# Patient Record
Sex: Male | Born: 1965 | Race: Black or African American | Hispanic: No | Smoking: Never smoker
Health system: Southern US, Community
[De-identification: ages and names within clinical notes are randomized; demographics above are authoritative.]

## PROBLEM LIST (undated history)

## (undated) DIAGNOSIS — G4733 Obstructive sleep apnea (adult) (pediatric): Secondary | ICD-10-CM

## (undated) DIAGNOSIS — Z9989 Dependence on other enabling machines and devices: Secondary | ICD-10-CM

## (undated) DIAGNOSIS — E119 Type 2 diabetes mellitus without complications: Secondary | ICD-10-CM

## (undated) HISTORY — PX: KNEE CARTILAGE SURGERY: SHX688

## (undated) HISTORY — DX: Type 2 diabetes mellitus without complications: E11.9

## (undated) HISTORY — DX: Obstructive sleep apnea (adult) (pediatric): G47.33

## (undated) HISTORY — DX: Dependence on other enabling machines and devices: Z99.89

---

## 1999-04-30 ENCOUNTER — Emergency Department (HOSPITAL_COMMUNITY): Admission: EM | Admit: 1999-04-30 | Discharge: 1999-04-30 | Payer: Self-pay | Admitting: Emergency Medicine

## 2002-01-15 ENCOUNTER — Emergency Department (HOSPITAL_COMMUNITY): Admission: EM | Admit: 2002-01-15 | Discharge: 2002-01-15 | Payer: Self-pay | Admitting: *Deleted

## 2002-06-13 ENCOUNTER — Emergency Department (HOSPITAL_COMMUNITY): Admission: EM | Admit: 2002-06-13 | Discharge: 2002-06-14 | Payer: Self-pay

## 2002-06-14 ENCOUNTER — Encounter: Payer: Self-pay | Admitting: Emergency Medicine

## 2007-07-30 ENCOUNTER — Emergency Department (HOSPITAL_COMMUNITY): Admission: EM | Admit: 2007-07-30 | Discharge: 2007-07-30 | Payer: Self-pay | Admitting: Emergency Medicine

## 2011-11-20 ENCOUNTER — Ambulatory Visit: Payer: Self-pay | Admitting: *Deleted

## 2011-11-26 ENCOUNTER — Ambulatory Visit: Payer: Self-pay | Admitting: *Deleted

## 2012-06-07 ENCOUNTER — Emergency Department (HOSPITAL_COMMUNITY)
Admission: EM | Admit: 2012-06-07 | Discharge: 2012-06-08 | Disposition: A | Discharge status: Home / Self Care | Payer: 59 | Attending: Emergency Medicine | Admitting: Emergency Medicine

## 2012-06-07 ENCOUNTER — Encounter (HOSPITAL_COMMUNITY): Payer: Self-pay | Admitting: *Deleted

## 2012-06-07 DIAGNOSIS — R112 Nausea with vomiting, unspecified: Secondary | ICD-10-CM | POA: Insufficient documentation

## 2012-06-07 DIAGNOSIS — R51 Headache: Secondary | ICD-10-CM

## 2012-06-07 DIAGNOSIS — E119 Type 2 diabetes mellitus without complications: Secondary | ICD-10-CM | POA: Insufficient documentation

## 2012-06-07 DIAGNOSIS — I10 Essential (primary) hypertension: Secondary | ICD-10-CM | POA: Insufficient documentation

## 2012-06-07 DIAGNOSIS — G43909 Migraine, unspecified, not intractable, without status migrainosus: Secondary | ICD-10-CM | POA: Insufficient documentation

## 2012-06-07 MED ORDER — ONDANSETRON 8 MG PO TBDP
8.0000 mg | ORAL_TABLET | Freq: Once | ORAL | Status: AC
  Administered 2012-06-07: 8 mg via ORAL
  Filled 2012-06-07: qty 1

## 2012-06-07 NOTE — ED Notes (Signed)
Pt c/o migraine since this am, vomiting and weakness that started today.

## 2012-06-07 NOTE — ED Notes (Signed)
Pt stated he started having migraines that put him on his knees with pain at 1400. Migraine decreased with zofran from 8/10 to 2.

## 2012-06-08 LAB — COMPREHENSIVE METABOLIC PANEL
ALT: 47 U/L (ref 0–53)
AST: 28 U/L (ref 0–37)
CO2: 26 mEq/L (ref 19–32)
Calcium: 9.7 mg/dL (ref 8.4–10.5)
Creatinine, Ser: 0.97 mg/dL (ref 0.50–1.35)
GFR calc Af Amer: >90 mL/min (ref >90)
GFR calc non Af Amer: >90 mL/min (ref >90)
Sodium: 135 mEq/L (ref 135–145)
Total Protein: 6.9 g/dL (ref 6.0–8.3)

## 2012-06-08 LAB — CBC WITH DIFFERENTIAL/PLATELET
Basophils Absolute: 0 10*3/uL (ref 0.0–0.1)
Basophils Relative: 0 % (ref 0–1)
Eosinophils Absolute: 0.2 10*3/uL (ref 0.0–0.7)
Eosinophils Relative: 4 % (ref 0–5)
HCT: 40.8 % (ref 39.0–52.0)
MCH: 27 pg (ref 26.0–34.0)
MCHC: 32.4 g/dL (ref 30.0–36.0)
MCV: 83.6 fL (ref 78.0–100.0)
Monocytes Absolute: 0.5 10*3/uL (ref 0.1–1.0)
Platelets: 245 10*3/uL (ref 150–400)
RDW: 13.7 % (ref 11.5–15.5)
WBC: 6.2 10*3/uL (ref 4.0–10.5)

## 2012-06-08 LAB — URINALYSIS, ROUTINE W REFLEX MICROSCOPIC
Bilirubin Urine: NEGATIVE
Hgb urine dipstick: NEGATIVE
Nitrite: NEGATIVE
Specific Gravity, Urine: 1.023 (ref 1.005–1.030)
Urobilinogen, UA: 0.2 mg/dL (ref 0.0–1.0)
pH: 6 (ref 5.0–8.0)

## 2012-06-08 MED ORDER — METOCLOPRAMIDE HCL 5 MG/ML IJ SOLN
10.0000 mg | Freq: Once | INTRAMUSCULAR | Status: AC
  Administered 2012-06-08: 10 mg via INTRAVENOUS

## 2012-06-08 MED ORDER — PROMETHAZINE HCL 25 MG PO TABS: 25.0000 mg | ORAL_TABLET | Freq: Four times a day (QID) | ORAL | Status: DC | PRN

## 2012-06-08 MED ORDER — DEXAMETHASONE SODIUM PHOSPHATE 10 MG/ML IJ SOLN
10.0000 mg | Freq: Once | INTRAMUSCULAR | Status: AC
  Administered 2012-06-08: 10 mg via INTRAVENOUS
  Filled 2012-06-08: qty 1

## 2012-06-08 MED ORDER — DIPHENHYDRAMINE HCL 50 MG/ML IJ SOLN
12.5000 mg | Freq: Once | INTRAMUSCULAR | Status: AC
  Administered 2012-06-08: 12.5 mg via INTRAVENOUS

## 2012-06-08 NOTE — ED Provider Notes (Signed)
Medical screening examination/treatment/procedure(s) were performed by non-physician practitioner and as supervising physician I was immediately available for consultation/collaboration.  Olivia Mackie, MD 06/08/12 [phone]

## 2012-06-08 NOTE — ED Provider Notes (Signed)
History     CSN: [id number]  Arrival date & time 06/07/12  1959   First MD Initiated Contact with Patient 06/07/12 2359      Chief Complaint  Patient presents with  . Migraine  . Nausea  . Emesis    (Consider location/radiation/quality/duration/timing/severity/associated sxs/prior treatment) HPI History provided by pt.   Pt presents w/ c/o migraine.  Woke w/ severe, constant, throbbing pain in left frontal region at 2pm today.  No relief w/ aspirin but pain has gradually improved and is currently 1/10.  Associated w/ N/V and photophobia.  Denies fever, vision changes and dizziness.  No recent head trauma.  Has never been officially diagnosed w/ migraines but has had a similar headache approx once every 2 months for the past 1-2 years.   Past Medical History  Diagnosis Date  . Hypertension   . Diabetes mellitus without complication     Past Surgical History  Procedure Date  . Knee cartilage surgery     History reviewed. No pertinent family history.  History  Substance Use Topics  . Smoking status: Not on file  . Smokeless tobacco: Not on file  . Alcohol Use: No      Review of Systems  All other systems reviewed and are negative.    Allergies  Review of patient's allergies indicates no known allergies.  Home Medications   Current Outpatient Rx  Name  Route  Sig  Dispense  Refill  . ASPIRIN 325 MG PO TABS   Oral   Take 325 mg by mouth every 4 (four) hours as needed. Pain         . PSEUDOEPH-DOXYLAMINE-DM-APAP 60-7.11-25-998 MG/30ML PO LIQD   Oral   Take 30 mLs by mouth as needed. Cold symptoms           BP 129/85  Pulse 77  Temp 97.7 F (36.5 C) (Oral)  Resp 24  SpO2 100%  Physical Exam  Nursing note and vitals reviewed. Constitutional: He is oriented to person, place, and time. He appears well-developed and well-nourished. No distress.  HENT:  Head: Normocephalic and atraumatic.  Eyes:       Normal appearance  Neck: Normal range of  motion.       No meningeal signs  Cardiovascular: Normal rate, regular rhythm and intact distal pulses.   Pulmonary/Chest: Effort normal and breath sounds normal.  Musculoskeletal: Normal range of motion.  Neurological: He is alert and oriented to person, place, and time. No sensory deficit. Coordination normal.       CN 3-12 intact.  No nystagmus. 5/5 and equal upper and lower extremity strength.  No past pointing.     Skin: Skin is warm and dry. No rash noted.  Psychiatric: He has a normal mood and affect. His behavior is normal.    ED Course  Procedures (including critical care time)  Labs Reviewed  COMPREHENSIVE METABOLIC PANEL - Abnormal; Notable for the following:    Glucose, Bld 110 (*)     All other components within normal limits  CBC WITH DIFFERENTIAL  URINALYSIS, ROUTINE W REFLEX MICROSCOPIC   No results found.   1. Headache       MDM  Pt presents w/ non-traumatic headache.  Has has similar pain approx every 2 months for the past 1-2 years but has never been evaluated.  Pain currently minimal. On exam, pt well-appearing, afebrile, no focal neuro deficits or meningeal signs.  Nursing staff obtained labs which are pending.  Will treat w/ IV  reglan and decadron and then d/c home w/ referral to neuro.    Labs unremarkable. Results discussed w/ pt.  Pt reports that his pain and nausea have resolved.  D/c'd home w/ phenergan to relieve nausea and possibly headache pain, and recommended aleve as well.  Referred to neuro for further evaluation.  Return precautions discussed. 2:46 AM           Otilio Miu, PA-C 06/08/12 0246

## 2012-06-08 NOTE — ED Notes (Signed)
Pt made aware of need for urine specimen. Pt given urinal and call bell and instructed to let staff know when able to provide sample.

## 2012-11-01 ENCOUNTER — Encounter: Payer: Self-pay | Admitting: Family Medicine

## 2013-01-13 ENCOUNTER — Ambulatory Visit (INDEPENDENT_AMBULATORY_CARE_PROVIDER_SITE_OTHER): Payer: 59 | Admitting: Emergency Medicine

## 2013-01-13 VITALS — BP 130/72 | HR 91 | Temp 97.9°F | Resp 18 | Ht 72.0 in | Wt 315.0 lb

## 2013-01-13 DIAGNOSIS — Z0000 Encounter for general adult medical examination without abnormal findings: Secondary | ICD-10-CM

## 2013-01-13 NOTE — Progress Notes (Signed)
[facility] [address] [place] [address] [phone]  Date:  01/13/2013   Name:  Joshua Paul   DOB:  1966/01/04   MRN:  [id number]  PCP:  No primary provider on file.    Chief Complaint: Annual Exam   History of Present Illness:  Joshua Paul is a 47 y.o. very pleasant male patient who presents with the following:  Wellness examination.  Problem list contains HBP and NIDDM but he is not and has never been treated for these conditions.  No improvement with over the counter medications or other home remedies. Denies other complaint or health concern today.   There are no active problems to display for this patient.   Past Medical History  Diagnosis Date  . Hypertension   . Diabetes mellitus without complication     Past Surgical History  Procedure Laterality Date  . Knee cartilage surgery      History  Substance Use Topics  . Smoking status: Never Smoker   . Smokeless tobacco: Not on file  . Alcohol Use: No    History reviewed. No pertinent family history.  No Known Allergies  Medication list has been reviewed and updated.  Current Outpatient Prescriptions on File Prior to Visit  Medication Sig Dispense Refill  . aspirin 325 MG tablet Take 325 mg by mouth every 4 (four) hours as needed. Pain      . promethazine (PHENERGAN) 25 MG tablet Take 1 tablet (25 mg total) by mouth every 6 (six) hours as needed for nausea.  20 tablet  0  . Pseudoeph-Doxylamine-DM-APAP (NYQUIL) 60-7.11-25-998 MG/30ML LIQD Take 30 mLs by mouth as needed. Cold symptoms       No current facility-administered medications on file prior to visit.    Review of Systems:  As per HPI, otherwise negative.    Physical Examination: Filed Vitals:   01/13/13 1244  BP: 130/72  Pulse: 91  Temp: 97.9 F (36.6 C)  Resp: 18   Filed Vitals:   01/13/13 1244  Height: 6' (1.829 m)  Weight: 315 lb (142.883 kg)   Body mass index is 42.71 kg/(m^2). Ideal Body  Weight: Weight in (lb) to have BMI = 25: 183.9  GEN: overweight, NAD, Non-toxic, A & O x 3 HEENT: Atraumatic, Normocephalic. Neck supple. No masses, No LAD. Ears and Nose: No external deformity. CV: RRR, No M/G/R. No JVD. No thrill. No extra heart sounds. PULM: CTA B, no wheezes, crackles, rhonchi. No retractions. No resp. distress. No accessory muscle use. ABD: S, NT, ND, +BS. No rebound. No HSM. EXTR: No c/c/e NEURO Normal gait.  PSYCH: Normally interactive. Conversant. Not depressed or anxious appearing.  Calm demeanor.    Assessment and Plan: Wellness examination. Obesity Advised to lose weight. Needs labs   Signed,  Phillips Odor, MD

## 2013-01-13 NOTE — Patient Instructions (Addendum)
Calorie Counting Diet A calorie counting diet requires you to eat the number of calories that are right for you in a day. Calories are the measurement of how much energy you get from the food you eat. Eating the right amount of calories is important for staying at a healthy weight. If you eat too many calories, your body will store them as fat and you may gain weight. If you eat too few calories, you may lose weight. Counting the number of calories you eat during a day will help you know if you are eating the right amount. A Registered Dietitian can determine how many calories you need in a day. The amount of calories needed varies from person to person. If your goal is to lose weight, you will need to eat fewer calories. Losing weight can benefit you if you are overweight or have health problems such as heart disease, high blood pressure, or diabetes. If your goal is to gain weight, you will need to eat more calories. Gaining weight may be necessary if you have a certain health problem that causes your body to need more energy. TIPS Whether you are increasing or decreasing the number of calories you eat during a day, it may be hard to get used to changes in what you eat and drink. The following are tips to help you keep track of the number of calories you eat.  Measure foods at home with measuring cups. This helps you know the amount of food and number of calories you are eating.  Restaurants often serve food in amounts that are larger than 1 serving. While eating out, estimate how many servings of a food you are given. For example, a serving of cooked rice is  cup or about the size of half of a fist. Knowing serving sizes will help you be aware of how much food you are eating at restaurants.  Ask for smaller portion sizes or child-size portions at restaurants.  Plan to eat half of a meal at a restaurant. Take the rest home or share the other half with a friend.  Read the Nutrition Facts panel on  food labels for calorie content and serving size. You can find out how many servings are in a package, the size of a serving, and the number of calories each serving has.  For example, a package might contain 3 cookies. The Nutrition Facts panel on that package says that 1 serving is 1 cookie. Below that, it will say there are 3 servings in the container. The calories section of the Nutrition Facts label says there are 90 calories. This means there are 90 calories in 1 cookie (1 serving). If you eat 1 cookie you have eaten 90 calories. If you eat all 3 cookies, you have eaten 270 calories (3 servings x 90 calories = 270 calories). The list below tells you how big or small some common portion sizes are.  1 oz.........4 stacked dice.  3 oz.........Deck of cards.  1 tsp........Tip of little finger.  1 tbs........Thumb.  2 tbs........Golf ball.   cup.......Half of a fist.  1 cup........A fist. KEEP A FOOD LOG Write down every food item you eat, the amount you eat, and the number of calories in each food you eat during the day. At the end of the day, you can add up the total number of calories you have eaten. It may help to keep a list like the one below. Find out the calorie information by reading the   Nutrition Facts panel on food labels. Breakfast  Bran cereal (1 cup, 110 calories).  Fat-free milk ( cup, 45 calories). Snack  Apple (1 medium, 80 calories). Lunch  Spinach (1 cup, 20 calories).  Tomato ( medium, 20 calories).  Chicken breast strips (3 oz, 165 calories).  Shredded cheddar cheese ( cup, 110 calories).  Light Italian dressing (2 tbs, 60 calories).  Whole-wheat bread (1 slice, 80 calories).  Tub margarine (1 tsp, 35 calories).  Vegetable soup (1 cup, 160 calories). Dinner  Pork chop (3 oz, 190 calories).  Brown rice (1 cup, 215 calories).  Steamed broccoli ( cup, 20 calories).  Strawberries (1  cup, 65 calories).  Whipped cream (1 tbs, 50  calories). Daily Calorie Total: 1425 Document Released: 06/15/2005 Document Revised: 09/07/2011 Document Reviewed: 12/10/2006 [employer] Patient Information 2014 [employer].  

## 2013-12-06 ENCOUNTER — Ambulatory Visit (INDEPENDENT_AMBULATORY_CARE_PROVIDER_SITE_OTHER): Payer: 59 | Admitting: Emergency Medicine

## 2013-12-06 VITALS — BP 132/66 | HR 89 | Temp 97.9°F | Ht 72.1 in | Wt 329.0 lb

## 2013-12-06 DIAGNOSIS — Z0000 Encounter for general adult medical examination without abnormal findings: Secondary | ICD-10-CM

## 2013-12-06 LAB — POCT URINALYSIS DIPSTICK
Bilirubin, UA: NEGATIVE
GLUCOSE UA: NEGATIVE
Ketones, UA: NEGATIVE
Leukocytes, UA: NEGATIVE
NITRITE UA: NEGATIVE
PH UA: 5.5
PROTEIN UA: NEGATIVE
RBC UA: 15
Spec Grav, UA: <=1.005
UROBILINOGEN UA: 0.2

## 2013-12-06 LAB — POCT UA - MICROSCOPIC ONLY
CASTS, UR, LPF, POC: NEGATIVE
CRYSTALS, UR, HPF, POC: NEGATIVE
MUCUS UA: NEGATIVE
YEAST UA: NEGATIVE

## 2013-12-06 NOTE — Addendum Note (Signed)
Addended by: Carmelina Dane on: 12/06/2013 07:40 PM   Modules accepted: Orders

## 2013-12-06 NOTE — Progress Notes (Signed)
[facility] [address] [place] [address] [phone]  Date:  12/06/2013   Name:  Joshua Paul.   DOB:  [birth year]   MRN:  [id number]  PCP:  No PCP Per Patient    Chief Complaint: Annual Exam   History of Present Illness:  Safir Taulbee. is a 48 y.o. very pleasant male patient who presents with the following:  Wellness examination. No medications.  Denies other complaint or health concern today.   There are no active problems to display for this patient.   Past Medical History  Diagnosis Date  . Hypertension   . Diabetes mellitus without complication     Past Surgical History  Procedure Laterality Date  . Knee cartilage surgery      History  Substance Use Topics  . Smoking status: Never Smoker   . Smokeless tobacco: Not on file  . Alcohol Use: No    History reviewed. No pertinent family history.  No Known Allergies  Medication list has been reviewed and updated.  No current outpatient prescriptions on file prior to visit.   No current facility-administered medications on file prior to visit.    Review of Systems:  As per HPI, otherwise negative.    Physical Examination: Filed Vitals:   12/06/13 1848  BP: 132/66  Pulse: 89  Temp: 97.9 F (36.6 C)   Filed Vitals:   12/06/13 1848  Height: 6' 0.1" (1.831 m)  Weight: 329 lb (149.233 kg)   Body mass index is 44.51 kg/(m^2). Ideal Body Weight: Weight in (lb) to have BMI = 25: 184.5  GEN: obese, NAD, Non-toxic, A & O x 3 HEENT: Atraumatic, Normocephalic. Neck supple. No masses, No LAD. Ears and Nose: No external deformity. CV: RRR, No M/G/R. No JVD. No thrill. No extra heart sounds. PULM: CTA B, no wheezes, crackles, rhonchi. No retractions. No resp. distress. No accessory muscle use. ABD: S, NT, ND, +BS. No rebound. No HSM. EXTR: No c/c/e NEURO Normal gait.  PSYCH: Normally interactive. Conversant. Not depressed or anxious appearing.  Calm demeanor.     Assessment and Plan: Wellness examination Obese  Signed,  Phillips Odor, MD

## 2014-03-31 ENCOUNTER — Ambulatory Visit (INDEPENDENT_AMBULATORY_CARE_PROVIDER_SITE_OTHER): Payer: 59 | Admitting: Emergency Medicine

## 2014-03-31 VITALS — BP 134/83 | HR 96 | Temp 99.0°F | Resp 16 | Ht 72.5 in | Wt 329.2 lb

## 2014-03-31 DIAGNOSIS — R509 Fever, unspecified: Secondary | ICD-10-CM

## 2014-03-31 DIAGNOSIS — J029 Acute pharyngitis, unspecified: Secondary | ICD-10-CM

## 2014-03-31 LAB — POCT CBC
Granulocyte percent: 77.9 %G (ref 37–80)
HCT, POC: 44.7 % (ref 43.5–53.7)
Hemoglobin: 14 g/dL — AB (ref 14.1–18.1)
LYMPH, POC: 1.4 (ref 0.6–3.4)
MCH: 27.4 pg (ref 27–31.2)
MCHC: 31.4 g/dL — AB (ref 31.8–35.4)
MCV: 87.2 fL (ref 80–97)
MID (CBC): 0.7 (ref 0–0.9)
MPV: 7.2 fL (ref 0–99.8)
PLATELET COUNT, POC: 265 10*3/uL (ref 142–424)
POC Granulocyte: 7.2 — AB (ref 2–6.9)
POC LYMPH %: 15 % (ref 10–50)
POC MID %: 7.1 % (ref 0–12)
RBC: 5.13 M/uL (ref 4.69–6.13)
RDW, POC: 14.9 %
WBC: 9.2 10*3/uL (ref 4.6–10.2)

## 2014-03-31 LAB — POCT INFLUENZA A/B
Influenza A, POC: NEGATIVE
Influenza B, POC: NEGATIVE

## 2014-03-31 LAB — POCT RAPID STREP A (OFFICE): Rapid Strep A Screen: NEGATIVE

## 2014-03-31 MED ORDER — AZELASTINE HCL 0.1 % NA SOLN: 2.0000 | Freq: Two times a day (BID) | NASAL | Status: DC

## 2014-03-31 MED ORDER — BENZONATATE 100 MG PO CAPS: 100.0000 mg | ORAL_CAPSULE | Freq: Three times a day (TID) | ORAL | Status: DC | PRN

## 2014-03-31 NOTE — Progress Notes (Addendum)
This chart was scribed for Lesle Chris, MD by Luisa Dago, ED Scribe. This patient was seen in room 12 and the patient's care was started at 3:47 PM.  Subjective:    Patient ID: Joshua Space., male    DOB: [birth year], 48 y.o.   MRN: [id number]  Chief Complaint  Patient presents with  . Sore Throat    x 2 days   . Generalized Body Aches    x 2 days   . Headache     x 2 days     HPI Joshua Neal. is a 48 y.o. Male with a hx of HTN and DM without complications.   Pt presents to the office today with multiple complaints. He comes in with gradual onset sore throat that started 2 days ago. Pt is also complaining of associated generalized myalgia and H/A that also started 2 days ago. He states that the headache has subsided a little since he's been here. Joshua Paul reports a productive cough with thick sputum this AM, but has not had any recurrence since then. He denies any nasal congestion, sick contacts, fever, chills, diaphoresis, rhinorrhea, nausea, emesis, or abdominal pain.   There are no active problems to display for this patient.  Past Medical History  Diagnosis Date  . Hypertension   . Diabetes mellitus without complication    Past Surgical History  Procedure Laterality Date  . Knee cartilage surgery     No Known Allergies Prior to Admission medications   Not on File   History   Social History  . Marital Status: Married    Spouse Name: N/A    Number of Children: N/A  . Years of Education: N/A   Occupational History  . Not on file.   Social History Main Topics  . Smoking status: Never Smoker   . Smokeless tobacco: Not on file  . Alcohol Use: No  . Drug Use: No  . Sexual Activity: Not on file   Other Topics Concern  . Not on file   Social History Narrative  . No narrative on file    Review of Systems  Constitutional: Positive for fatigue. Negative for fever and chills.  HENT: Positive for ear pain and sore throat. Negative for  congestion and trouble swallowing.   Eyes: Negative for discharge.  Respiratory: Positive for cough.   Cardiovascular: Negative for chest pain.  Gastrointestinal: Negative for nausea, vomiting and abdominal pain.  Genitourinary: Negative for frequency and hematuria.  Skin: Negative for rash.  Neurological: Positive for headaches. Negative for dizziness.  Psychiatric/Behavioral: Negative for hallucinations.       Objective:   Physical Exam  Nursing note and vitals reviewed. Constitutional: He is oriented to person, place, and time. He appears well-developed and well-nourished. No distress.  HENT:  Head: Normocephalic and atraumatic.  Right Ear: External ear normal.  Left Ear: External ear normal.  Mouth/Throat: Posterior oropharyngeal erythema (mild) present.  Significant nasal congestion with purulent drainage inside his nose.   Eyes: Conjunctivae are normal. Right eye exhibits no discharge. Left eye exhibits no discharge.  Neck: Neck supple.  Cardiovascular: Normal rate, regular rhythm and normal heart sounds.  Exam reveals no gallop and no friction rub.   No murmur heard. Pulmonary/Chest: Effort normal and breath sounds normal. No respiratory distress.  Abdominal: Soft. He exhibits no distension. There is no tenderness.  Musculoskeletal: He exhibits no edema and no tenderness.  Neurological: He is alert and oriented to person, place, and  time.  Skin: Skin is warm and dry.  Psychiatric: He has a normal mood and affect. His behavior is normal. Thought content normal.    Filed Vitals:   03/31/14 1524  BP: 134/83  Pulse: 96  Temp: 99 F (37.2 C)  TempSrc: Oral  Resp: 16  Height: 6' 0.5" (1.842 m)  Weight: 329 lb 3.2 oz (149.324 kg)  SpO2: 98%   Results for orders placed in visit on 03/31/14  POCT INFLUENZA A/B      Result Value Ref Range   Influenza A, POC Negative     Influenza B, POC Negative    POCT RAPID STREP A (OFFICE)      Result Value Ref Range   Rapid Strep  A Screen Negative  Negative   Results for orders placed in visit on 03/31/14  POCT INFLUENZA A/B      Result Value Ref Range   Influenza A, POC Negative     Influenza B, POC Negative    POCT RAPID STREP A (OFFICE)      Result Value Ref Range   Rapid Strep A Screen Negative  Negative  POCT CBC      Result Value Ref Range   WBC 9.2  4.6 - 10.2 K/uL   Lymph, poc 1.4  0.6 - 3.4   POC LYMPH PERCENT 15.0  10 - 50 %L   MID (cbc) 0.7  0 - 0.9   POC MID % 7.1  0 - 12 %M   POC Granulocyte 7.2 (*) 2 - 6.9   Granulocyte percent 77.9  37 - 80 %G   RBC 5.13  4.69 - 6.13 M/uL   Hemoglobin 14.0 (*) 14.1 - 18.1 g/dL   HCT, POC 16.144.7  09.643.5 - 53.7 %   MCV 87.2  80 - 97 fL   MCH, POC 27.4  27 - 31.2 pg   MCHC 31.4 (*) 31.8 - 35.4 g/dL   RDW, POC 04.514.9     Platelet Count, POC 265  142 - 424 K/uL   MPV 7.2  0 - 99.8 fL    Assessment & Plan:   Will treat with Tessalon Perles, Astelin and  out of work today and tomorrow . I personally performed the services described in this documentation, which was scribed in my presence. The recorded information has been reviewed and is accurate.

## 2014-04-03 LAB — CULTURE, GROUP A STREP: Organism ID, Bacteria: NORMAL

## 2014-06-29 DIAGNOSIS — E119 Type 2 diabetes mellitus without complications: Secondary | ICD-10-CM

## 2014-06-29 HISTORY — DX: Type 2 diabetes mellitus without complications: E11.9

## 2014-10-29 ENCOUNTER — Ambulatory Visit (INDEPENDENT_AMBULATORY_CARE_PROVIDER_SITE_OTHER): Payer: 59 | Admitting: Family Medicine

## 2014-10-29 VITALS — BP 116/90 | HR 93 | Temp 98.2°F | Resp 16 | Ht 72.0 in | Wt 335.0 lb

## 2014-10-29 DIAGNOSIS — R05 Cough: Secondary | ICD-10-CM

## 2014-10-29 DIAGNOSIS — R5383 Other fatigue: Secondary | ICD-10-CM | POA: Diagnosis not present

## 2014-10-29 DIAGNOSIS — M6208 Separation of muscle (nontraumatic), other site: Secondary | ICD-10-CM

## 2014-10-29 DIAGNOSIS — R52 Pain, unspecified: Secondary | ICD-10-CM | POA: Diagnosis not present

## 2014-10-29 DIAGNOSIS — R059 Cough, unspecified: Secondary | ICD-10-CM

## 2014-10-29 LAB — POCT CBC
GRANULOCYTE PERCENT: 75.4 % (ref 37–80)
HCT, POC: 44.1 % (ref 43.5–53.7)
Hemoglobin: 14.3 g/dL (ref 14.1–18.1)
LYMPH, POC: 1 (ref 0.6–3.4)
MCH, POC: 27.3 pg (ref 27–31.2)
MCHC: 32.4 g/dL (ref 31.8–35.4)
MCV: 84.1 fL (ref 80–97)
MID (CBC): 0.3 (ref 0–0.9)
MPV: 7.1 fL (ref 0–99.8)
POC GRANULOCYTE: 3.7 (ref 2–6.9)
POC LYMPH PERCENT: 19.4 %L (ref 10–50)
POC MID %: 5.2 %M (ref 0–12)
Platelet Count, POC: 261 10*3/uL (ref 142–424)
RBC: 5.25 M/uL (ref 4.69–6.13)
RDW, POC: 15.1 %
WBC: 4.9 10*3/uL (ref 4.6–10.2)

## 2014-10-29 LAB — POCT INFLUENZA A/B
INFLUENZA A, POC: NEGATIVE
INFLUENZA B, POC: POSITIVE

## 2014-10-29 MED ORDER — HYDROCODONE-HOMATROPINE 5-1.5 MG/5ML PO SYRP: 5.0000 mL | ORAL_SOLUTION | Freq: Three times a day (TID) | ORAL | Status: DC | PRN

## 2014-10-29 MED ORDER — MELOXICAM 7.5 MG PO TABS: 7.5000 mg | ORAL_TABLET | Freq: Every day | ORAL | Status: DC

## 2014-10-29 NOTE — Progress Notes (Signed)
Subjective:    Patient ID: Joshua SpaceNathaniel E Hege Jr., male    DOB: [birth year], 49 y.o.   MRN: [id number]  Chief Complaint  Patient presents with  . Generalized Body Aches    Onset 5 days  . Nasal Congestion   There are no active problems to display for this patient.  Medications, allergies, past medical history, surgical history, family history, social history and problem list reviewed and updated.  HPI  49 yo obese male presents with congestion, body aches, and epigastric chest discomfort.   Sx started suddenly 5 days ago with fatigue, head/nasal congestion, generalized body aches. Sx have persisted. Mild prod persistent cough past couple days. Chills past couple days, no fevers. No appetite. Denies abd pain, n/v, diarrhea. Able to stomach liquids and keep them down. Interested in being tested for flu though aware tamiflu not beneficial at this time.   Also mentions epigastric tenderness past couple days along with noticing some skin darkening in the area. No exertional pain. No assoc sob. Not assoc with meals. Discomfort intermittent, sometimes worse with movement or while lying flat. No radiation through to back. No assoc presyncope, syncope.   Review of Systems See HPI.     Objective:   Physical Exam  Constitutional: He appears well-developed and well-nourished.  Non-toxic appearance. He does not have a sickly appearance. He appears ill. No distress.  BP 116/90 mmHg  Pulse 93  Temp(Src) 98.2 F (36.8 C) (Oral)  Resp 16  Ht 6' (1.829 m)  Wt 335 lb (151.955 kg)  BMI 45.42 kg/m2  SpO2 96%   HENT:  Right Ear: Tympanic membrane normal.  Left Ear: Tympanic membrane normal.  Nose: Nose normal. Right sinus exhibits no maxillary sinus tenderness and no frontal sinus tenderness. Left sinus exhibits no maxillary sinus tenderness and no frontal sinus tenderness.  Mouth/Throat: Uvula is midline, oropharynx is clear and moist and mucous membranes are normal.  Neck: No Brudzinski's sign  noted.  Cardiovascular: Normal rate, regular rhythm and normal heart sounds.   Pulmonary/Chest: Effort normal and breath sounds normal. No tachypnea.    4cm x 3cm circular bulge noted sub-xiphoid. Firm. Not mobile. TTP. Overlying lesion appears to be old scar. No overlying erythema. No fluctuance. No surrounding induration.   Abdominal: Soft. Normal appearance and bowel sounds are normal. He exhibits no abdominal bruit and no pulsatile midline mass. There is tenderness in the epigastric area. There is no rigidity, no rebound, no guarding, no CVA tenderness, no tenderness at McBurney's point and negative Murphy's sign.  Lymphadenopathy:       Head (right side): No submental, no submandibular and no tonsillar adenopathy present.       Head (left side): No submental, no submandibular and no tonsillar adenopathy present.    He has no cervical adenopathy.    Results for orders placed or performed in visit on 10/29/14  POCT Influenza A/B  Result Value Ref Range   Influenza A, POC Negative    Influenza B, POC Positive   POCT CBC  Result Value Ref Range   WBC 4.9 4.6 - 10.2 K/uL   Lymph, poc 1.0 0.6 - 3.4   POC LYMPH PERCENT 19.4 10 - 50 %L   MID (cbc) 0.3 0 - 0.9   POC MID % 5.2 0 - 12 %M   POC Granulocyte 3.7 2 - 6.9   Granulocyte percent 75.4 37 - 80 %G   RBC 5.25 4.69 - 6.13 M/uL   Hemoglobin 14.3 14.1 - 18.1  g/dL   HCT, POC 16.1 09.6 - 53.7 %   MCV 84.1 80 - 97 fL   MCH, POC 27.3 27 - 31.2 pg   MCHC 32.4 31.8 - 35.4 g/dL   RDW, POC 04.5 %   Platelet Count, POC 261 142 - 424 K/uL   MPV 7.1 0 - 99.8 fL       Assessment & Plan:   49 yo obese male presents with congestion, body aches, and epigastric chest discomfort.   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

## 2014-10-29 NOTE — Progress Notes (Signed)
Patient discussed and examined with Mr. McVeigh. Agree with assessment and plan of care per his note.   

## 2014-10-29 NOTE — Patient Instructions (Signed)
You tested positive for the flu. You are too far out from the onset to benefit from tamiflu.  Rest, fluids, tylenol as needed will all help. You have a separation of the muscles at the top of your abdomen. This is likely from coughing.  Be sure to take the cough medicine as needed up to every 8 hours.  Please come back to see me Friday. I'll be here from 8am - 6pm so we can check on the bulge. If it is improved that is great. If it is continuing and is causing pain, we'll likely refer you for a CT scan.

## 2014-11-02 ENCOUNTER — Ambulatory Visit (INDEPENDENT_AMBULATORY_CARE_PROVIDER_SITE_OTHER): Payer: 59 | Admitting: Physician Assistant

## 2014-11-02 VITALS — BP 130/80 | HR 85 | Temp 97.9°F | Resp 16 | Ht 72.0 in | Wt 333.4 lb

## 2014-11-02 DIAGNOSIS — M6208 Separation of muscle (nontraumatic), other site: Secondary | ICD-10-CM | POA: Diagnosis not present

## 2014-11-02 NOTE — Patient Instructions (Signed)
Please be sure to pick up the cough medicine and the anti inflammatory medication. Please take them as prescribed for the next 5 days.  We'd like to see you back on Thursday 11/08/14 after you've been on the cough medicine for that time to see if the bulge has gotten any smaller. If not we may refer you for a CT scan.

## 2014-11-02 NOTE — Progress Notes (Signed)
   Subjective:    Patient ID: Joshua SpaceNathaniel E Halbert Jr., male    DOB: Oct 29, 1965, 49 y.o.   MRN: [id number]  Chief Complaint  Patient presents with  . Follow up    chest bruise    There are no active problems to display for this patient.  No chest pain, SOB, HA, dizziness, vision change, N/V, diarrhea, constipation, dysuria, urinary urgency or frequency, myalgias, arthralgias or rash.  HPI  Pt seen here 10/29/14 for uri sx and bulge in chest. Positive for flu at that time. 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 Was given meloxicam and hycodan. Instructed to take these meds, hopefully keeping cough at bay, and rtc in 4 days for evaluation with possible ct ordered if bulge persisted.  Today he returns feeling better recovered from the flu. He never picked up his meloxicam or hycodan. Has not been taking any cough medicine and has continued to cough. Denies cp, sob, trouble swallowing, vomiting.   Review of Systems See HPI.     Objective:   Physical Exam  Constitutional: He is oriented to person, place, and time. He appears well-developed and well-nourished.  Non-toxic appearance. He does not have a sickly appearance. He does not appear ill. No distress.  BP 130/80 mmHg  Pulse 85  Temp(Src) 97.9 F (36.6 C) (Oral)  Resp 16  Ht 6' (1.829 m)  Wt 333 lb 6.4 oz (151.229 kg)  BMI 45.21 kg/m2  SpO2 96%   Pulmonary/Chest:  Bulge persisting, same size (4cm x 3cm) just distal to sub xiphoid process. Mildly tender to palpation.   Neurological: He is alert and oriented to person, place, and time.  Psychiatric: He has a normal mood and affect. His speech is normal and behavior is normal.      Assessment & Plan:   Rectus diastasis --persisting but pt has not started taking his cough medicine and continues to cough --instructed to fill hycodan and meloxicam --rtc 6 days to determine if bulge persisting despite getting  rid of cough --poss CT if persisting at that time  Donnajean Lopesodd M. Briton Sellman, PA-C Physician Assistant-Certified [facility]  11/02/2014 2:58 PM

## 2015-01-03 ENCOUNTER — Ambulatory Visit (INDEPENDENT_AMBULATORY_CARE_PROVIDER_SITE_OTHER): Payer: 59 | Admitting: Family Medicine

## 2015-01-03 VITALS — BP 128/89 | HR 81 | Temp 98.8°F | Resp 18 | Ht 73.0 in | Wt 334.2 lb

## 2015-01-03 DIAGNOSIS — E669 Obesity, unspecified: Secondary | ICD-10-CM

## 2015-01-03 DIAGNOSIS — E119 Type 2 diabetes mellitus without complications: Secondary | ICD-10-CM

## 2015-01-03 DIAGNOSIS — Z125 Encounter for screening for malignant neoplasm of prostate: Secondary | ICD-10-CM

## 2015-01-03 DIAGNOSIS — Z13220 Encounter for screening for lipoid disorders: Secondary | ICD-10-CM

## 2015-01-03 DIAGNOSIS — Z131 Encounter for screening for diabetes mellitus: Secondary | ICD-10-CM

## 2015-01-03 DIAGNOSIS — Z23 Encounter for immunization: Secondary | ICD-10-CM

## 2015-01-03 DIAGNOSIS — Z0000 Encounter for general adult medical examination without abnormal findings: Secondary | ICD-10-CM | POA: Diagnosis not present

## 2015-01-03 DIAGNOSIS — Z6841 Body Mass Index (BMI) 40.0 and over, adult: Secondary | ICD-10-CM | POA: Diagnosis not present

## 2015-01-03 DIAGNOSIS — Z8042 Family history of malignant neoplasm of prostate: Secondary | ICD-10-CM | POA: Diagnosis not present

## 2015-01-03 DIAGNOSIS — G4733 Obstructive sleep apnea (adult) (pediatric): Secondary | ICD-10-CM | POA: Diagnosis not present

## 2015-01-03 DIAGNOSIS — Z9989 Dependence on other enabling machines and devices: Secondary | ICD-10-CM

## 2015-01-03 LAB — POCT URINALYSIS DIPSTICK
Bilirubin, UA: NEGATIVE
Glucose, UA: NEGATIVE
Ketones, UA: NEGATIVE
Leukocytes, UA: NEGATIVE
NITRITE UA: NEGATIVE
PH UA: 5.5
PROTEIN UA: NEGATIVE
RBC UA: NEGATIVE
Spec Grav, UA: 1.025
UROBILINOGEN UA: 0.2

## 2015-01-03 NOTE — Progress Notes (Signed)
Subjective:   This chart was scribed for Nilda Simmer, MD by Jarvis Morgan, ED Scribe. This patient was seen in Room 12 and the patient's care was started at 5:33 PM.   Patient ID: Joshua Space., male    DOB: January 23, 1966, 49 y.o.   MRN: [id number]  01/03/2015  Annual Exam   HPI  HPI Comments: Joshua Benincasa. is a 49 y.o. male who [facility] a complete [facility] physical. Pt's last physical was 1 year ago.  Past Medical History Pt has a h/o asthma and OSA. Pt has never had a colonoscopy or needed to have one. Pt is unsure of the date of his last tetanus shot. Pt received a flu shot in December 2015. Received Hepatitis B series with employment 22 years ago. Pt denies need to see an eye doctor. He states he is due for a dental checkup. He states he last asthma attack was when he was 49 years old and has not had one since. Pt wears his CPAP every night. He had knee cartilage surgery in his left knee. He denies any other recent surgeries or recent hospitalizations.   Social History He states he recently began exercising again. He does swimming and light weights. Pt has been married for 20 years and has 1 son who is 1 y.o. 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 Pt was formerly in the [employer] for 15 years. He states he always wears a seat belt. He denies any known allergies. He is a non smoker. He denies ETOH or illicit drug use.   Family History Mother is 19 years old with no chronic health problems. Father is 49 and is currently battling cancer, pt is unable to state what type of cancer. He reports his father has a h/o prostate cancer which he was dx with 15 years ago. Pt has 2 brothers, one has a HTN and the other has no chronic medical problems. He states he had an uncle that died at age 69 from colon cancer last year.   He reports a h/o shoulder pain but states he never followed up on it and he still has pain but it has begun  to resolve. Pt also admits that he sometimes has numbness to the top of his feet but believes it could be due to the shoes he wears for work. He denies any polyuria, frequent urination at night, or mouth lesions that won't heal.  He denies any tinnitus, HA, dizziness, blurred vision, double vision, SOB, chest pain, swelling in legs or feet, or cough. He denies a personal h/o colon cancer but as stated above, he does have a h/o colon cancer in his family. He denies any melena or bloody stools, nausea, vomiting, diarrhea or abdominal pain. He admits he has a bowel movement every day. Pt denies any anxiety or depression. He denies any penile dysfunction or decreased sex drive.  Review of Systems  Constitutional: Negative for fever, chills, diaphoresis, activity change, appetite change, fatigue and unexpected weight change.  HENT: Negative for congestion, dental problem, drooling, ear discharge, ear pain, facial swelling, hearing loss, mouth sores, nosebleeds, postnasal drip, rhinorrhea, sinus pressure, sneezing, sore throat, tinnitus, trouble swallowing and voice change.   Eyes: Negative for photophobia, pain, discharge, redness, itching and visual disturbance.  Respiratory: Negative for apnea, cough, choking, chest tightness, shortness of breath, wheezing and stridor.   Cardiovascular: Negative for chest pain, palpitations and leg swelling.  Gastrointestinal: Negative for nausea, vomiting, abdominal pain, diarrhea, constipation, blood in stool, abdominal distention, anal bleeding and rectal pain.  Endocrine: Negative for cold intolerance, heat intolerance, polydipsia, polyphagia and polyuria.  Genitourinary: Negative for dysuria, urgency, frequency, hematuria, flank pain, decreased urine volume, discharge, penile swelling, scrotal swelling, enuresis, difficulty urinating, genital sores, penile pain and testicular pain.  Musculoskeletal: Positive for arthralgias (mild left shoulder pain; gradually  resolving). Negative for myalgias, back pain, joint swelling, gait problem, neck pain and neck stiffness.  Skin: Negative for color change, pallor, rash and wound.  Allergic/Immunologic: Negative for environmental allergies, food allergies and immunocompromised state.  Neurological: Negative for dizziness, tremors, seizures, syncope, facial asymmetry, speech difficulty, weakness, light-headedness, numbness and headaches.  Hematological: Negative for adenopathy. Does not bruise/bleed easily.  Psychiatric/Behavioral: Positive for sleep disturbance. Negative for suicidal ideas, hallucinations, behavioral problems, confusion, self-injury, dysphoric mood, decreased concentration and agitation. The patient is not nervous/anxious and is not hyperactive.     Past Medical History  Diagnosis Date  . Hypertension   . Diabetes mellitus without complication   . Asthma     childhood only.  . OSA on CPAP    Past Surgical History  Procedure Laterality Date  . Knee cartilage surgery     No Known Allergies Current Outpatient Prescriptions  Medication Sig Dispense Refill  . HYDROcodone-homatropine (HYCODAN) 5-1.5 MG/5ML syrup Take 5 mLs by mouth every 8 (eight) hours as needed for cough. (Patient not taking: Reported on 11/02/2014) 120 mL 0   No current facility-administered medications for this visit.   History   Social History  . Marital Status: Married    Spouse Name: N/A  . Number of Children: N/A  . Years of Education: N/A   Occupational History  . Not on file.   Social History Main Topics  . Smoking status: Never Smoker   . Smokeless tobacco: Not on file  . Alcohol Use: No  . Drug Use: No  . Sexual Activity: Not on file   Other Topics Concern  . 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      Tobacco: none      Alcohol:  none      Drugs :none      Exercise:  Just started exercising again in 2016; swimming and weight lifting      Seatbelt: 100%      Family History  Problem Relation Age of Onset  . Arthritis Mother   . Cancer Father 2553    prostate cancer   . Hypertension Brother         Objective:    Triage Vitals: BP 128/89 mmHg  Pulse 81  Temp(Src) 98.8 F (37.1 C) (Oral)  Resp 18  Ht 6\' 1"  (1.854 m)  Wt 334 lb 3.2 oz (151.592 kg)  BMI 44.10 kg/m2  SpO2 97%  Physical Exam  Constitutional: He is oriented to person, place, and time. He appears well-developed and well-nourished. No distress.  Obese male  HENT:  Head: Normocephalic and atraumatic.  Right Ear: Tympanic membrane, external ear and ear canal normal.  Left Ear: Tympanic membrane, external ear and ear canal normal.  Nose: Nose normal.  Mouth/Throat: Oropharynx is clear and moist.  Eyes: Conjunctivae and EOM are normal. Pupils are equal, round,  and reactive to light.  Neck: Normal range of motion. Neck supple. Carotid bruit is not present. No tracheal deviation present. No thyromegaly present.  Cardiovascular: Normal rate, regular rhythm, normal heart sounds and intact distal pulses.  Exam reveals no gallop and no friction rub.   No murmur heard. Pulmonary/Chest: Effort normal and breath sounds normal. No respiratory distress. He has no wheezes. He has no rales.  Abdominal: Soft. Normal appearance and bowel sounds are normal. He exhibits no distension and no mass. There is no tenderness. There is no rebound and no guarding. Hernia confirmed negative in the right inguinal area and confirmed negative in the left inguinal area.  Genitourinary: Rectum normal, prostate normal, testes normal and penis normal.  Musculoskeletal: Normal range of motion. He exhibits no tenderness.       Right shoulder: Normal.       Left shoulder: Normal.       Cervical back: Normal.  Lymphadenopathy:    He has no cervical adenopathy.  Neurological: He  is alert and oriented to person, place, and time. He has normal reflexes. No cranial nerve deficit. He exhibits normal muscle tone. Coordination normal.  Skin: Skin is warm and dry. No rash noted. He is not diaphoretic.  Psychiatric: He has a normal mood and affect. His behavior is normal. Judgment and thought content normal.  Nursing note and vitals reviewed.  Results for orders placed or performed in visit on 01/03/15  POCT urinalysis dipstick  Result Value Ref Range   Color, UA yellow    Clarity, UA clear    Glucose, UA neg    Bilirubin, UA neg    Ketones, UA neg    Spec Grav, UA 1.025    Blood, UA neg    pH, UA 5.5    Protein, UA neg    Urobilinogen, UA 0.2    Nitrite, UA neg    Leukocytes, UA Negative Negative       Assessment & Plan:   1. Routine physical examination   2. Screening, lipid   3. Screening for diabetes mellitus   4. Obesity   5. BMI 40.0-44.9, adult   6. Screening for prostate cancer   7. Family history of prostate cancer in father   51. Need for Tdap vaccination   9. OSA on CPAP     1. Complete Physical Examination:  Anticipatory guidance -- exercise, weight loss.  S/p TDAP in office.  Will warrant colonoscopy at age 70.  Clearance for participation in [facility]. 2.  Screening lipid: obtain FLP. 3. Screening DMII: obtain glucose, HgbA1c. 4.  Obesity/BMI 44:  Recommend weight loss, exercise, low-sugar and low-caloric food choices. 5.  Screening prostate cancer: s/p DRE; obtain PSA. 6.  Family history of prostate cancer:  Warrants PSA 7.  OSA: compliance with CPAP. 8.  S/p TDAP   No orders of the defined types were placed in this encounter.    Return in about 1 year (around 01/03/2016) for complete physical examiniation.  I personally performed the services described in this documentation, which was scribed in my presence. The recorded information has been reviewed and considered.  Obadiah Dennard Paulita Fujita, M.D. [facility] [address] [place], [place]  [address] [phone] phone [phone] fax

## 2015-01-03 NOTE — Patient Instructions (Signed)

## 2015-01-04 LAB — TSH: TSH: 1.244 u[IU]/mL (ref 0.350–4.500)

## 2015-01-04 LAB — CBC WITH DIFFERENTIAL/PLATELET
Basophils Absolute: 0 10*3/uL (ref 0.0–0.1)
Basophils Relative: 0 % (ref 0–1)
EOS ABS: 0.1 10*3/uL (ref 0.0–0.7)
EOS PCT: 2 % (ref 0–5)
HCT: 43.4 % (ref 39.0–52.0)
Hemoglobin: 14.2 g/dL (ref 13.0–17.0)
LYMPHS ABS: 1.2 10*3/uL (ref 0.7–4.0)
Lymphocytes Relative: 18 % (ref 12–46)
MCH: 27.5 pg (ref 26.0–34.0)
MCHC: 32.7 g/dL (ref 30.0–36.0)
MCV: 84.1 fL (ref 78.0–100.0)
MONO ABS: 0.5 10*3/uL (ref 0.1–1.0)
MONOS PCT: 8 % (ref 3–12)
MPV: 9.8 fL (ref 8.6–12.4)
NEUTROS PCT: 72 % (ref 43–77)
Neutro Abs: 4.8 10*3/uL (ref 1.7–7.7)
PLATELETS: 313 10*3/uL (ref 150–400)
RBC: 5.16 MIL/uL (ref 4.22–5.81)
RDW: 14.8 % (ref 11.5–15.5)
WBC: 6.6 10*3/uL (ref 4.0–10.5)

## 2015-01-04 LAB — LIPID PANEL
CHOL/HDL RATIO: 9 ratio
CHOLESTEROL: 297 mg/dL — AB (ref 0–200)
HDL: 33 mg/dL — ABNORMAL LOW (ref >=40)
LDL Cholesterol: 193 mg/dL — ABNORMAL HIGH (ref 0–99)
Triglycerides: 355 mg/dL — ABNORMAL HIGH (ref <150)
VLDL: 71 mg/dL — AB (ref 0–40)

## 2015-01-04 LAB — HEMOGLOBIN A1C
Hgb A1c MFr Bld: 8.1 % — ABNORMAL HIGH (ref <5.7)
MEAN PLASMA GLUCOSE: 186 mg/dL — AB (ref <117)

## 2015-01-04 LAB — COMPREHENSIVE METABOLIC PANEL
ALK PHOS: 44 U/L (ref 39–117)
ALT: 63 U/L — ABNORMAL HIGH (ref 0–53)
AST: 38 U/L — ABNORMAL HIGH (ref 0–37)
Albumin: 4.6 g/dL (ref 3.5–5.2)
BILIRUBIN TOTAL: 0.7 mg/dL (ref 0.2–1.2)
BUN: 10 mg/dL (ref 6–23)
CO2: 25 mEq/L (ref 19–32)
CREATININE: 1.01 mg/dL (ref 0.50–1.35)
Calcium: 9.9 mg/dL (ref 8.4–10.5)
Chloride: 103 mEq/L (ref 96–112)
GLUCOSE: 129 mg/dL — AB (ref 70–99)
Potassium: 4.4 mEq/L (ref 3.5–5.3)
SODIUM: 139 meq/L (ref 135–145)
TOTAL PROTEIN: 7.2 g/dL (ref 6.0–8.3)

## 2015-01-05 LAB — PSA: PSA: 1.47 ng/mL (ref <=4.00)

## 2015-01-13 NOTE — Addendum Note (Signed)
Addended by: Ethelda ChickSMITH, Chadwick Reiswig M on: 01/13/2015 11:38 AM   Modules accepted: Orders

## 2015-01-14 ENCOUNTER — Encounter: Payer: Self-pay | Admitting: Family Medicine

## 2015-02-18 ENCOUNTER — Encounter: Payer: Self-pay | Admitting: Family Medicine

## 2015-02-20 NOTE — Progress Notes (Signed)
This encounter was created in error - please disregard.

## 2016-01-08 ENCOUNTER — Encounter: Payer: Self-pay | Admitting: Physician Assistant

## 2016-01-08 ENCOUNTER — Ambulatory Visit (INDEPENDENT_AMBULATORY_CARE_PROVIDER_SITE_OTHER): Payer: 59 | Admitting: Physician Assistant

## 2016-01-08 VITALS — BP 120/80 | HR 60 | Temp 97.8°F | Resp 18 | Ht 73.0 in | Wt 299.0 lb

## 2016-01-08 DIAGNOSIS — G4733 Obstructive sleep apnea (adult) (pediatric): Secondary | ICD-10-CM

## 2016-01-08 DIAGNOSIS — Z0000 Encounter for general adult medical examination without abnormal findings: Secondary | ICD-10-CM

## 2016-01-08 DIAGNOSIS — E669 Obesity, unspecified: Secondary | ICD-10-CM | POA: Insufficient documentation

## 2016-01-08 DIAGNOSIS — Z9989 Dependence on other enabling machines and devices: Secondary | ICD-10-CM

## 2016-01-08 NOTE — Progress Notes (Signed)
[facility] [address] [place] [address] [phone]  Date:  01/08/2016   Name:  Joshua Paul.   DOB:  12-01-1965   MRN:  [id number]  PCP:  No PCP Per Patient    Chief Complaint: Annual Exam   History of Present Illness:  This is a 50 y.o. 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 Never had any problems.  He states he always brings his CPAP on trips and routes an extension cord from the bathroom into his tent.  He has been working on losing weight of the past 4-5 months. Has lost over 30 pounds. 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 He gained that weight and after football never lost it. He has started weight lifting the past few months and is helping him a lot. Pt states he noticed since losing weight that his breast bone sticks out more. No pain but wondering if this is normal.  Immunizations: tdap 2016 Eye: He is noticing his vision with reading is more blurry. He is planning to make an appt soon.  Tobacco/alcohol/substance use: no/no/no   Review of Systems:  Review of Systems  Constitutional: Negative.   HENT: Negative.   Eyes: Positive for visual disturbance.  Respiratory: Positive for apnea.   Cardiovascular: Negative.   Gastrointestinal: Negative.   Endocrine: Negative.   Genitourinary: Negative.   Musculoskeletal: Negative.   Skin: Negative.   Allergic/Immunologic: Negative.   Neurological: Negative.   Hematological: Negative.   Psychiatric/Behavioral: Negative.     There are no active problems to display for this patient.   Prior to Admission medications   Medication Sig Start Date End Date Taking? Authorizing Provider  HYDROcodone-homatropine (HYCODAN) 5-1.5 MG/5ML syrup Take 5 mLs by mouth every 8 (eight) hours as needed for cough. Patient not taking: Reported on 11/02/2014 10/29/14    Raelyn Ensign, PA    No Known Allergies  Past Surgical History  Procedure Laterality Date  . Knee cartilage surgery      Social History  Substance Use Topics  . Smoking status: Never Smoker   . Smokeless tobacco: None  . Alcohol Use: No    Family History  Problem Relation Age of Onset  . Arthritis Mother   . Cancer Father 63    prostate cancer   . Hypertension Brother   . Diabetes Maternal Grandmother   . Cancer Maternal Grandfather     Medication list has been reviewed and updated.  Physical Examination:  Physical Exam  Constitutional: He is oriented to person, place, and time. He appears well-developed and well-nourished. No distress.  HENT:  Head: Normocephalic and atraumatic.  Right Ear: Hearing, tympanic membrane, external ear and ear canal normal.  Left Ear: Hearing, tympanic membrane, external ear and ear canal normal.  Nose: Nose normal.  Mouth/Throat: Uvula is midline, oropharynx is clear and moist and mucous membranes are normal.  Eyes: Conjunctivae, EOM and lids are normal. Pupils are equal, round, and reactive to light. Right eye exhibits no discharge. Left eye exhibits no discharge. No scleral icterus.  Neck: Trachea normal and normal range of motion. Neck supple. Carotid bruit is not present. No thyromegaly present.  Cardiovascular: Normal rate, regular rhythm, normal heart sounds, intact distal pulses and normal pulses.   No murmur heard. Pulmonary/Chest: Effort normal and breath sounds normal. No respiratory  distress. He has no wheezes. He has no rhonchi. He has no rales.  Abdominal: Soft. Normal appearance. There is no tenderness. No hernia.  Prominent xiphoid process. No tenderness  Musculoskeletal: Normal range of motion. He exhibits no edema or tenderness.  Lymphadenopathy:       Head (right side): No submental, no submandibular and no tonsillar adenopathy present.       Head (left side): No submental, no submandibular and no tonsillar adenopathy  present.    He has no cervical adenopathy.  Neurological: He is alert and oriented to person, place, and time. Coordination and gait normal.  Skin: Skin is warm, dry and intact. No lesion and no rash noted.  Psychiatric: He has a normal mood and affect. His speech is normal and behavior is normal. Judgment and thought content normal.   BP 120/80 mmHg  Pulse 60  Temp(Src) 97.8 F (36.6 C) (Oral)  Resp 18  Ht 6\' 1"  (1.854 m)  Wt 299 lb (135.626 kg)  BMI 39.46 kg/m2  SpO2 98%   Visual Acuity Screening   Right eye Left eye Both eyes  Without correction: 20/20 20/25 20/15   With correction:      Assessment and Plan:  1. Annual physical exam 2. OSA on CPAP 3. 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 His medical history includes OSA and obesity. He is compliant with CPAP even on trips. He is working on weight loss and has lost >30 pounds in the past 4-5 months. He is up to date on vaccines. Forms filled out. Return as needed.   Roswell MinersNicole V. Dyke BrackettBush, PA-C, MHS [facility]  01/08/2016

## 2016-01-08 NOTE — Patient Instructions (Signed)
     IF you received an x-ray today, you will receive an invoice from [facility]. Please contact [facility] at [phone] with questions or concerns regarding your invoice.   IF you received labwork today, you will receive an invoice from [facility]. Please contact [facility] at [phone] with questions or concerns regarding your invoice.   Our billing staff will not be able to assist you with questions regarding bills from these companies.  You will be contacted with the lab results as soon as they are available. The fastest way to get your results is to activate your My Chart account. Instructions are located on the last page of this paperwork. If you have not heard from us regarding the results in 2 weeks, please contact this office.      

## 2016-04-08 ENCOUNTER — Ambulatory Visit (INDEPENDENT_AMBULATORY_CARE_PROVIDER_SITE_OTHER): Payer: 59 | Admitting: Physician Assistant

## 2016-04-08 VITALS — BP 122/72 | HR 77 | Temp 97.8°F | Resp 17 | Ht 72.5 in | Wt 301.0 lb

## 2016-04-08 DIAGNOSIS — J3489 Other specified disorders of nose and nasal sinuses: Secondary | ICD-10-CM

## 2016-04-08 DIAGNOSIS — J111 Influenza due to unidentified influenza virus with other respiratory manifestations: Secondary | ICD-10-CM

## 2016-04-08 DIAGNOSIS — R531 Weakness: Secondary | ICD-10-CM

## 2016-04-08 LAB — POCT INFLUENZA A/B
Influenza A, POC: NEGATIVE
Influenza B, POC: NEGATIVE

## 2016-04-08 MED ORDER — OSELTAMIVIR PHOSPHATE 75 MG PO CAPS: 75.0000 mg | ORAL_CAPSULE | Freq: Two times a day (BID) | ORAL | 0 refills | Status: DC

## 2016-04-08 MED ORDER — FLUTICASONE PROPIONATE 50 MCG/ACT NA SUSP: 2.0000 | Freq: Every day | NASAL | 12 refills | Status: DC

## 2016-04-08 NOTE — Patient Instructions (Addendum)
Thank you for coming in today. I hope you feel we met your needs.  Feel free to call [facility] if you have any questions or further requests.  Please consider signing up for MyChart if you do not already have it, as this is a great way to communicate with me.  Best,  Whitney McVey, PA-C     IF you received an x-ray today, you will receive an invoice from [facility]. Please contact [facility] at [phone] with questions or concerns regarding your invoice.   IF you received labwork today, you will receive an invoice from [facility]. Please contact [facility] at [phone] with questions or concerns regarding your invoice.   Our billing staff will not be able to assist you with questions regarding bills from these companies.  You will be contacted with the lab results as soon as they are available. The fastest way to get your results is to activate your My Chart account. Instructions are located on the last page of this paperwork. If you have not heard from [place] regarding the results in 2 weeks, please contact this office.      Influenza, Adult Influenza ("the flu") is a viral infection of the respiratory tract. It occurs more often in winter months because people spend more time in close contact with one another. Influenza can make you feel very sick. Influenza easily spreads from person to person (contagious). CAUSES  Influenza is caused by a virus that infects the respiratory tract. You can catch the virus by breathing in droplets from an infected person's cough or sneeze. You can also catch the virus by touching something that was recently contaminated with the virus and then touching your mouth, nose, or eyes. RISKS AND COMPLICATIONS You may be at risk for a more severe case of influenza if you smoke cigarettes, have diabetes, have chronic heart disease (such as heart failure) or lung disease (such as asthma), or if you have a weakened immune  system. Elderly people and pregnant women are also at risk for more serious infections. The most common problem of influenza is a lung infection (pneumonia). Sometimes, this problem can require emergency medical care and may be life threatening. SIGNS AND SYMPTOMS  Symptoms typically last 4 to 10 days and may include:  Fever.  Chills.  Headache, body aches, and muscle aches.  Sore throat.  Chest discomfort and cough.  Poor appetite.  Weakness or feeling tired.  Dizziness.  Nausea or vomiting. DIAGNOSIS  Diagnosis of influenza is often made based on your history and a physical exam. A nose or throat swab test can be done to confirm the diagnosis. TREATMENT  In mild cases, influenza goes away on its own. Treatment is directed at relieving symptoms. For more severe cases, your health care provider may prescribe antiviral medicines to shorten the sickness. Antibiotic medicines are not effective because the infection is caused by a virus, not by bacteria. HOME CARE INSTRUCTIONS  Take medicines only as directed by your health care provider.  Use a cool mist humidifier to make breathing easier.  Get plenty of rest until your temperature returns to normal. This usually takes 3 to 4 days.  Drink enough fluid to keep your urine clear or pale yellow.  Cover yourmouth and nosewhen coughing or sneezing,and wash your handswellto prevent thevirusfrom spreading.  Stay homefromwork orschool untilthe fever is gonefor at least 68fll day. PREVENTION  An annual influenza vaccination (flu shot) is the best way to avoid getting influenza. An  annual flu shot is now routinely recommended for all adults in the [place] IF:  You experiencechest pain, yourcough worsens,or you producemore mucus.  Youhave nausea,vomiting, ordiarrhea.  Your fever returns or gets worse. SEEK IMMEDIATE MEDICAL CARE IF:  You havetrouble breathing, you become short of breath,or your  skin ornails becomebluish.  You have severe painor stiffnessin the neck.  You develop a sudden headache, or pain in the face or ear.  You have nausea or vomiting that you cannot control. MAKE SURE YOU:   Understand these instructions.  Will watch your condition.  Will get help right away if you are not doing well or get worse.   This information is not intended to replace advice given to you by your health care provider. Make sure you discuss any questions you have with your health care provider.   Document Released: 06/12/2000 Document Revised: 07/06/2014 Document Reviewed: 09/14/2011 Elsevier Interactive Patient Education [employer].

## 2016-04-08 NOTE — Progress Notes (Signed)
   Joshua Paul.  MRN: [id number] DOB: 1966-04-18  PCP: No PCP Per Patient  Subjective:  Pt is a pleasant 50 year old male presents to clinic for illness x one day.  Last night he was congested and sneezing a lot. This morning he broke out in a sweat and experienced lots of drainage from his nose. Notes associated weakness.  No known sick contacts. Denies fever, headache, cough, chest pain or pressure. No seasonal allergies.  He is an Technical sales engineerofficer at the [employer], he is nervous that this weakness will impede with the physical requirements of his job duties   Weight loss - Is working on losing weight for his health. 338 lbs to 288 lbs over 7 months. Is taking metabolite to help speed up metabolism.   Review of Systems  Constitutional: Positive for chills, diaphoresis and fatigue. Negative for fever.  HENT: Positive for congestion, rhinorrhea and sneezing. Negative for sore throat.   Respiratory: Negative for cough, chest tightness, shortness of breath and wheezing.   Cardiovascular: Negative for chest pain and palpitations.  Genitourinary: Negative for difficulty urinating, dysuria and urgency.  Allergic/Immunologic: Negative for environmental allergies.  Neurological: Negative for weakness, light-headedness and headaches.    Patient Active Problem List   Diagnosis Date Noted  . OSA on CPAP 01/08/2016  . Obesity 01/08/2016    No current outpatient prescriptions on file prior to visit.   No current facility-administered medications on file prior to visit.     No Known Allergies  Objective:  BP 122/72 (BP Location: Right Arm, Patient Position: Sitting, Cuff Size: Normal)   Pulse 77   Temp 97.8 F (36.6 C) (Oral)   Resp 17   Ht 6' 0.5" (1.842 m)   Wt (!) 301 lb (136.5 kg)   SpO2 97%   BMI 40.26 kg/m   Physical Exam  Constitutional: He is oriented to person, place, and time and well-developed, well-nourished, and in no distress. He does not have a sickly  appearance. No distress.  HENT:  Right Ear: Hearing, tympanic membrane, external ear and ear canal normal.  Left Ear: Hearing, tympanic membrane, external ear and ear canal normal.  Nose: Mucosal edema and rhinorrhea present.  Mouth/Throat: Uvula is midline and mucous membranes are normal. Posterior oropharyngeal edema present. No oropharyngeal exudate or posterior oropharyngeal erythema.  Cardiovascular: Normal rate, regular rhythm and normal heart sounds.   Neurological: He is alert and oriented to person, place, and time. GCS score is 15.  Skin: Skin is warm.  Psychiatric: Mood, memory, affect and judgment normal.  Vitals reviewed.  Results for orders placed or performed in visit on 04/08/16  POCT Influenza A/B  Result Value Ref Range   Influenza A, POC Negative Negative   Influenza B, POC Negative Negative    Assessment and Plan :  1. Rhinorrhea 2. Weakness 3. Influenza - POCT Influenza A/B - oseltamivir (TAMIFLU) 75 MG capsule; Take 1 capsule (75 mg total) by mouth 2 (two) times daily.  Dispense: 10 capsule; Refill: 0 - Original flu test resulted in positive A and B. Lab tech elected to retest due to infrequency of +A and B results. Despite negative results on the second encounter, elect to procede with Tamiflu treatment due to patient's H&P.  - Supportive care encouraged: Rest and plenty of fluids   Marco CollieWhitney Lynisha Osuch, PA-C  [facility] 04/08/2016 8:17 AM

## 2017-11-19 ENCOUNTER — Other Ambulatory Visit: Payer: Self-pay

## 2017-11-19 ENCOUNTER — Encounter: Payer: Self-pay | Admitting: Family Medicine

## 2017-11-19 ENCOUNTER — Ambulatory Visit (INDEPENDENT_AMBULATORY_CARE_PROVIDER_SITE_OTHER): Payer: 59 | Admitting: Family Medicine

## 2017-11-19 VITALS — BP 128/60 | HR 70 | Temp 98.7°F | Ht 75.0 in | Wt 301.0 lb

## 2017-11-19 DIAGNOSIS — E119 Type 2 diabetes mellitus without complications: Secondary | ICD-10-CM

## 2017-11-19 DIAGNOSIS — Z1329 Encounter for screening for other suspected endocrine disorder: Secondary | ICD-10-CM

## 2017-11-19 DIAGNOSIS — Z13228 Encounter for screening for other metabolic disorders: Secondary | ICD-10-CM | POA: Diagnosis not present

## 2017-11-19 DIAGNOSIS — Z13220 Encounter for screening for lipoid disorders: Secondary | ICD-10-CM | POA: Diagnosis not present

## 2017-11-19 DIAGNOSIS — Z0000 Encounter for general adult medical examination without abnormal findings: Secondary | ICD-10-CM

## 2017-11-19 DIAGNOSIS — Z1211 Encounter for screening for malignant neoplasm of colon: Secondary | ICD-10-CM | POA: Diagnosis not present

## 2017-11-19 DIAGNOSIS — Z125 Encounter for screening for malignant neoplasm of prostate: Secondary | ICD-10-CM | POA: Diagnosis not present

## 2017-11-19 DIAGNOSIS — Z130 Encounter for screening for diseases of the blood and blood-forming organs and certain disorders involving the immune mechanism: Secondary | ICD-10-CM

## 2017-11-19 LAB — POCT GLYCOSYLATED HEMOGLOBIN (HGB A1C): Hemoglobin A1C: 6.1 % — AB (ref 4.0–5.6)

## 2017-11-19 NOTE — Progress Notes (Signed)
5/24/20193:06 PM  Joshua Paul. 11-Dec-1965, 52 y.o. male [id number]  Chief Complaint  Patient presents with  . Annual Exam    also need physical form filled out for [employer]    HPI:   Patient is a 52 y.o. 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  Last CPE in 2017  Colorectal Cancer Screening: ordered today, had uncle with colon cancer at age 76 Prostate Cancer Screening: 2016, father had prostate cancer HIV Screening: declines Seasonal Influenza Vaccination: will need this season Td/Tdap Vaccination: 2016 Pneumococcal Vaccination: maybe Zoster Vaccination: n/a Frequency of Dental evaluation: Q6 months Frequency of Eye evaluation: needs exam  Wears cpap every night, takes when he travels He continues to work on [employer] and weight loss efforts Reports he was not aware of a1c in 2016 of 8.1  Fall Risk  11/19/2017 04/08/2016 01/08/2016  Falls in the past year? No No No     Depression screen [facility] 2/9 11/19/2017 04/08/2016 01/08/2016  Decreased Interest 0 0 0  Down, Depressed, Hopeless 0 0 0  PHQ - 2 Score 0 0 0    No Known Allergies  Prior to Admission medications   Not on File    Past Medical History:  Diagnosis Date  . DM2 (diabetes mellitus, type 2) (HCC) 2016   a1c 8.1  . OSA on CPAP     Past Surgical History:  Procedure Laterality Date  . KNEE CARTILAGE SURGERY      Social History   Tobacco Use  . Smoking status: Never Smoker  . Smokeless tobacco: Never Used  Substance Use Topics  . Alcohol use: No    Family History  Problem Relation Age of Onset  . Arthritis Mother   . Cancer Mother   . Cancer Father 19       prostate cancer   . Hypertension Brother   . Diabetes Maternal Grandmother   . Cancer Maternal Grandfather     Review of Systems  Constitutional: Negative for chills, fever and malaise/fatigue.  Eyes: Negative for blurred vision.  Respiratory: Negative for  cough and shortness of breath.   Cardiovascular: Negative for chest pain, palpitations and leg swelling.  Gastrointestinal: Negative for abdominal pain, nausea and vomiting.  Genitourinary: Negative for frequency and urgency.  Neurological: Negative for tingling and sensory change.  Endo/Heme/Allergies: Negative for polydipsia.  All other systems reviewed and are negative.    OBJECTIVE:  Blood pressure 128/60, pulse 70, temperature 98.7 F (37.1 C), temperature source Oral, height  (1.905 m), weight (!) 301 lb (136.5 kg), SpO2 96 %.  Wt Readings from Last 3 Encounters:  11/19/17 (!) 301 lb (136.5 kg)  04/08/16 (!) 301 lb (136.5 kg)  01/08/16 299 lb (135.6 kg)    Visual Acuity Screening   Right eye Left eye Both eyes  Without correction:  With correction:      Physical Exam  Constitutional: He is oriented to person, place, and time. He appears well-developed and well-nourished.  HENT:  Head: Normocephalic and atraumatic.  Right Ear: Hearing, tympanic membrane, external ear and ear canal normal.  Left Ear: Hearing, tympanic membrane, external ear and ear canal normal.  Mouth/Throat: Oropharynx is clear and moist. No oropharyngeal exudate.  Eyes: Pupils are equal, round, and reactive to light. Conjunctivae and EOM are normal.  Neck: Neck supple. No thyromegaly present.  Cardiovascular: Normal rate, regular rhythm, normal heart sounds  and intact distal pulses. Exam reveals no gallop and no friction rub.  No murmur heard. Pulmonary/Chest: Effort normal and breath sounds normal. He has no wheezes. He has no rhonchi. He has no rales.  Abdominal: Soft. Bowel sounds are normal. He exhibits no distension and no mass. There is no tenderness.  Musculoskeletal: Normal range of motion. He exhibits no edema.  Lymphadenopathy:    He has no cervical adenopathy.  Neurological: He is alert and oriented to person, place, and time. He has normal strength and normal  reflexes. No cranial nerve deficit. Coordination and gait normal.  Skin: Skin is warm and dry.  Psychiatric: He has a normal mood and affect.  Nursing note and vitals reviewed.    Results for orders placed or performed in visit on 11/19/17 (from the past 24 hour(s))  POCT glycosylated hemoglobin (Hb A1C)     Status: Abnormal   Collection Time: 11/19/17  3:40 PM  Result Value Ref Range   Hemoglobin A1C 6.1 (A) 4.0 - 5.6 %   HbA1c, POC (prediabetic range)  5.7 - 6.4 %   HbA1c, POC (controlled diabetic range)  0.0 - 7.0 %    ASSESSMENT and PLAN  1. Annual physical exam No concerns per history or exam. Routine HCM labs ordered. HCM reviewed/discussed. Anticipatory guidance regarding healthy weight, lifestyle and choices given.  Paperwork for boy scouts completed.  2. Screening for deficiency anemia - CBC with Differential/Platelet  3. Screening for metabolic disorder - Comprehensive metabolic panel - POCT glycosylated hemoglobin (Hb A1C)  4. Screening for prostate cancer - PSA  5. Screening, lipid - Lipid panel  6. Screening for thyroid disorder - TSH  7. Colon cancer screening - Ambulatory referral to Gastroenterology  8. Controlled type 2 diabetes mellitus without complication, without long-term current use of insulin (HCC) Based on a1c in 2016, today controlled on diet and lifestyle. Encouraged continuing with these LFM. Discussed briefly new diagnosis and implications to long term health and importance of control.   Return in about 6 months (around 05/22/2018).    Myles Lipps, MD Primary Care at [facility] [address] [place], [place] [address] Ph.  [phone] Fax [phone]

## 2017-11-19 NOTE — Patient Instructions (Addendum)
   IF you received an x-ray today, you will receive an invoice from [facility]. Please contact [facility] at [phone] with questions or concerns regarding your invoice.   IF you received labwork today, you will receive an invoice from [facility]. Please contact [facility] at [phone] with questions or concerns regarding your invoice.   Our billing staff will not be able to assist you with questions regarding bills from these companies.  You will be contacted with the lab results as soon as they are available. The fastest way to get your results is to activate your My Chart account. Instructions are located on the last page of this paperwork. If you have not heard from us regarding the results in 2 weeks, please contact this office.    Prediabetes Eating Plan Prediabetes-also called impaired glucose tolerance or impaired fasting glucose-is a condition that causes blood sugar (blood glucose) levels to be higher than normal. Following a healthy diet can help to keep prediabetes under control. It can also help to lower the risk of type 2 diabetes and heart disease, which are increased in people who have prediabetes. Along with regular exercise, a healthy diet:  Promotes weight loss.  Helps to control blood sugar levels.  Helps to improve the way that the body uses insulin.  What do I need to know about this eating plan?  Use the glycemic index (GI) to plan your meals. The index tells you how quickly a food will raise your blood sugar. Choose low-GI foods. These foods take a longer time to raise blood sugar.  Pay close attention to the amount of carbohydrates in the food that you eat. Carbohydrates increase blood sugar levels.  Keep track of how many calories you take in. Eating the right amount of calories will help you to achieve a healthy weight. Losing about 7 percent of your starting weight can help to prevent type 2 diabetes.  You may want to follow a  Mediterranean diet. This diet includes a lot of vegetables, lean meats or fish, whole grains, fruits, and healthy oils and fats. What foods can I eat? Grains Whole grains, such as whole-wheat or whole-grain breads, crackers, cereals, and pasta. Unsweetened oatmeal. Bulgur. Barley. Quinoa. Brown rice. Corn or whole-wheat flour tortillas or taco shells. Vegetables Lettuce. Spinach. Peas. Beets. Cauliflower. Cabbage. Broccoli. Carrots. Tomatoes. Squash. Eggplant. Herbs. Peppers. Onions. Cucumbers. Brussels sprouts. Fruits Berries. Bananas. Apples. Oranges. Grapes. Papaya. Mango. Pomegranate. Kiwi. Grapefruit. Cherries. Meats and Other Protein Sources Seafood. Lean meats, such as chicken and turkey or lean cuts of pork and beef. Tofu. Eggs. Nuts. Beans. Dairy Low-fat or fat-free dairy products, such as yogurt, cottage cheese, and cheese. Beverages Water. Tea. Coffee. Sugar-free or diet soda. Seltzer water. Milk. Milk alternatives, such as soy or almond milk. Condiments Mustard. Relish. Low-fat, low-sugar ketchup. Low-fat, low-sugar barbecue sauce. Low-fat or fat-free mayonnaise. Sweets and Desserts Sugar-free or low-fat pudding. Sugar-free or low-fat ice cream and other frozen treats. Fats and Oils Avocado. Walnuts. Olive oil. The items listed above may not be a complete list of recommended foods or beverages. Contact your dietitian for more options. What foods are not recommended? Grains Refined white flour and flour products, such as bread, pasta, snack foods, and cereals. Beverages Sweetened drinks, such as sweet iced tea and soda. Sweets and Desserts Baked goods, such as cake, cupcakes, pastries, cookies, and cheesecake. The items listed above may not be a complete list of foods and beverages to avoid. Contact your dietitian for more information.   This information is not intended to replace advice given to you by your health care provider. Make sure you discuss any questions you have with  your health care provider. Document Released: 10/30/2014 Document Revised: 11/21/2015 Document Reviewed: 07/11/2014 Elsevier Interactive Patient Education  2017 Elsevier Inc.  

## 2017-11-20 LAB — CBC WITH DIFFERENTIAL/PLATELET
Basophils Absolute: 0 10*3/uL (ref 0.0–0.2)
Basos: 0 %
EOS (ABSOLUTE): 0.3 10*3/uL (ref 0.0–0.4)
Eos: 4 %
Hematocrit: 44.3 % (ref 37.5–51.0)
Hemoglobin: 14.2 g/dL (ref 13.0–17.7)
Immature Grans (Abs): 0 10*3/uL (ref 0.0–0.1)
Immature Granulocytes: 0 %
Lymphocytes Absolute: 1.4 10*3/uL (ref 0.7–3.1)
Lymphs: 22 %
MCH: 27.1 pg (ref 26.6–33.0)
MCHC: 32.1 g/dL (ref 31.5–35.7)
MCV: 85 fL (ref 79–97)
Monocytes Absolute: 0.5 10*3/uL (ref 0.1–0.9)
Monocytes: 9 %
Neutrophils Absolute: 4 10*3/uL (ref 1.4–7.0)
Neutrophils: 65 %
Platelets: 255 10*3/uL (ref 150–450)
RBC: 5.24 x10E6/uL (ref 4.14–5.80)
RDW: 14.3 % (ref 12.3–15.4)
WBC: 6.2 10*3/uL (ref 3.4–10.8)

## 2017-11-20 LAB — LIPID PANEL
Chol/HDL Ratio: 4.4 ratio (ref 0.0–5.0)
Cholesterol, Total: 199 mg/dL (ref 100–199)
HDL: 45 mg/dL
LDL Calculated: 127 mg/dL — ABNORMAL HIGH (ref 0–99)
Triglycerides: 137 mg/dL (ref 0–149)
VLDL Cholesterol Cal: 27 mg/dL (ref 5–40)

## 2017-11-20 LAB — COMPREHENSIVE METABOLIC PANEL
ALT: 27 IU/L (ref 0–44)
AST: 19 IU/L (ref 0–40)
Albumin/Globulin Ratio: 2 (ref 1.2–2.2)
Albumin: 4.5 g/dL (ref 3.5–5.5)
Alkaline Phosphatase: 47 IU/L (ref 39–117)
BUN/Creatinine Ratio: 11 (ref 9–20)
BUN: 13 mg/dL (ref 6–24)
Bilirubin Total: 0.4 mg/dL (ref 0.0–1.2)
CO2: 23 mmol/L (ref 20–29)
Calcium: 9.7 mg/dL (ref 8.7–10.2)
Chloride: 105 mmol/L (ref 96–106)
Creatinine, Ser: 1.2 mg/dL (ref 0.76–1.27)
GFR calc Af Amer: 80 mL/min/{1.73_m2} (ref >59)
GFR calc non Af Amer: 70 mL/min/{1.73_m2} (ref >59)
Globulin, Total: 2.3 g/dL (ref 1.5–4.5)
Glucose: 106 mg/dL — ABNORMAL HIGH (ref 65–99)
Potassium: 4.4 mmol/L (ref 3.5–5.2)
Sodium: 143 mmol/L (ref 134–144)
Total Protein: 6.8 g/dL (ref 6.0–8.5)

## 2017-11-20 LAB — TSH: TSH: 1.51 u[IU]/mL (ref 0.450–4.500)

## 2017-11-20 LAB — PSA: Prostate Specific Ag, Serum: 2.8 ng/mL (ref 0.0–4.0)

## 2017-12-07 ENCOUNTER — Encounter: Payer: Self-pay | Admitting: Family Medicine

## 2017-12-08 ENCOUNTER — Encounter: Payer: Self-pay | Admitting: Family Medicine

## 2017-12-08 DIAGNOSIS — E119 Type 2 diabetes mellitus without complications: Secondary | ICD-10-CM | POA: Insufficient documentation

## 2018-01-24 ENCOUNTER — Encounter: Payer: Self-pay | Admitting: Family Medicine

## 2018-02-24 ENCOUNTER — Ambulatory Visit (INDEPENDENT_AMBULATORY_CARE_PROVIDER_SITE_OTHER): Payer: 59

## 2018-02-24 ENCOUNTER — Ambulatory Visit (HOSPITAL_COMMUNITY)
Admission: EM | Admit: 2018-02-24 | Discharge: 2018-02-24 | Disposition: A | Discharge status: Home / Self Care | Payer: 59 | Attending: Family Medicine | Admitting: Family Medicine

## 2018-02-24 ENCOUNTER — Encounter (HOSPITAL_COMMUNITY): Payer: Self-pay | Admitting: Emergency Medicine

## 2018-02-24 DIAGNOSIS — M25562 Pain in left knee: Secondary | ICD-10-CM | POA: Diagnosis not present

## 2018-02-24 DIAGNOSIS — S63297A Dislocation of distal interphalangeal joint of left little finger, initial encounter: Secondary | ICD-10-CM | POA: Diagnosis not present

## 2018-02-24 DIAGNOSIS — S60812A Abrasion of left wrist, initial encounter: Secondary | ICD-10-CM

## 2018-02-24 DIAGNOSIS — S60416A Abrasion of right little finger, initial encounter: Secondary | ICD-10-CM

## 2018-02-24 DIAGNOSIS — S63259A Unspecified dislocation of unspecified finger, initial encounter: Secondary | ICD-10-CM

## 2018-02-24 DIAGNOSIS — W108XXA Fall (on) (from) other stairs and steps, initial encounter: Secondary | ICD-10-CM

## 2018-02-24 MED ORDER — IBUPROFEN 800 MG PO TABS: 800.0000 mg | ORAL_TABLET | Freq: Three times a day (TID) | ORAL | 0 refills | Status: AC

## 2018-02-24 MED ORDER — TRAMADOL HCL 50 MG PO TABS: 50.0000 mg | ORAL_TABLET | Freq: Four times a day (QID) | ORAL | 0 refills | Status: DC | PRN

## 2018-02-24 MED ORDER — TRAMADOL HCL 50 MG PO TABS: 50.0000 mg | ORAL_TABLET | Freq: Four times a day (QID) | ORAL | 0 refills | Status: AC | PRN

## 2018-02-24 NOTE — ED Triage Notes (Signed)
Pt states he was carrying a mini fridge and thought he was at the last step and fell forward carrying it, pt has an abrasion on R pinkie finger, pt has abrasion on L wrist, pt L pinkie states he is unable to bend the top joint of his pinkie. Pt also c/o mild L knee soreness. Sensation intact.

## 2018-02-24 NOTE — Discharge Instructions (Addendum)
Please wear splint for the next 2 weeks, please go ahead and set up a follow up appointment with orthopedics  Use anti-inflammatories for pain/swelling. You may take up to 800 mg Ibuprofen every 8 hours with food. You may supplement Ibuprofen with Tylenol [phone] mg every 8 hours.   Tramadol for more severe pain or nighttime pain, will cause drowsiness  Watch for healing of abrasions- return if developing pain, redness, swelling or drainage Follow up if finger pain worsening

## 2018-02-24 NOTE — ED Provider Notes (Signed)
[facility]    CSN: [id number] Arrival date & time: 02/24/18  1824     History   Chief Complaint Chief Complaint  Patient presents with  . Finger Injury    HPI Joshua Spaceathaniel E Neumeyer Jr. is a 52 y.o. male hx of DMT2 presenting today for evaluation of finger injury. Patient was carrying mini-fridge, missed last step fell and finger got caught. Denies hitting head or LOC. After incident felt lightheaded and unable to bend tip of left pinky. Denies numbness/tingling/loss of sensation. Also notes abrasions to his fingers and left knee soreness. Denies difficulty bending knee or limping. Accident happened about an hour ago.   HPI  Past Medical History:  Diagnosis Date  . DM2 (diabetes mellitus, type 2) (HCC) 2016   a1c 8.1  . OSA on CPAP     Patient Active Problem List   Diagnosis Date Noted  . Controlled type 2 diabetes mellitus without complication, without long-term current use of insulin (HCC) 12/08/2017  . OSA on CPAP 01/08/2016  . Obesity 01/08/2016    Past Surgical History:  Procedure Laterality Date  . KNEE CARTILAGE SURGERY         Home Medications    Prior to Admission medications   Medication Sig Start Date End Date Taking? Authorizing Provider  ibuprofen (ADVIL,MOTRIN) 800 MG tablet Take 1 tablet (800 mg total) by mouth 3 (three) times daily. 02/24/18   Wieters, Hallie C, PA-C  ibuprofen (ADVIL,MOTRIN) 800 MG tablet Take 1 tablet (800 mg total) by mouth 3 (three) times daily. 02/24/18   Wieters, Hallie C, PA-C  traMADol (ULTRAM) 50 MG tablet Take 1 tablet (50 mg total) by mouth every 6 (six) hours as needed for up to 2 days. 02/24/18 02/26/18  Wieters, Junius CreamerHallie C, PA-C    Family History Family History  Problem Relation Age of Onset  . Arthritis Mother   . Cancer Mother   . Cancer Father [phone]       prostate cancer   . Hypertension Brother   . Diabetes Maternal Grandmother   . Cancer Maternal Grandfather     Social History Social History   Tobacco  Use  . Smoking status: Never Smoker  . Smokeless tobacco: Never Used  Substance Use Topics  . Alcohol use: No  . Drug use: No     Allergies   Patient has no known allergies.   Review of Systems Review of Systems  Constitutional: Negative for activity change, chills, diaphoresis and fatigue.  Eyes: Negative for photophobia and visual disturbance.  Respiratory: Negative for cough, chest tightness and shortness of breath.   Cardiovascular: Negative for chest pain and leg swelling.  Gastrointestinal: Negative for abdominal pain, nausea and vomiting.  Musculoskeletal: Positive for arthralgias, joint swelling and myalgias. Negative for back pain, gait problem, neck pain and neck stiffness.  Skin: Positive for wound. Negative for color change.  Neurological: Positive for light-headedness. Negative for dizziness, weakness, numbness and headaches.     Physical Exam Triage Vital Signs ED Triage Vitals [02/24/18 1858]  Enc Vitals Group     BP 127/80     Pulse Rate 67     Resp 16     Temp 97.8 F (36.6 C)     Temp src      SpO2 98 %     Weight      Height      Head Circumference      Peak Flow      Pain Score  Pain Loc      Pain Edu?      Excl. in GC?    No data found.  Updated Vital Signs BP 127/80   Pulse 67   Temp 97.8 F (36.6 C)   Resp 16   SpO2 98%   Visual Acuity Right Eye Distance:   Left Eye Distance:   Bilateral Distance:    Right Eye Near:   Left Eye Near:    Bilateral Near:     Physical Exam  Constitutional: He appears well-developed and well-nourished.  HENT:  Head: Normocephalic and atraumatic.  Eyes: Conjunctivae are normal.  Neck: Neck supple.  Cardiovascular: Normal rate and regular rhythm.  No murmur heard. Pulmonary/Chest: Effort normal and breath sounds normal. No respiratory distress.  Abdominal: He exhibits no distension.  Musculoskeletal: He exhibits no edema.  Palpable deformity of left index DIP, unable to bend at DIP, mild  swelling  Post reduction- ridge no longer palpated  Full active ROM of left knee, no obvious swelling or deformity, gait without abnormality  Neurological: He is alert.  sensation intact distally  Skin: Skin is warm and dry.  Small superficial abrasions to palmar surface of left index finger near DIP, abrasion to right distal finger on posterior surface   Psychiatric: He has a normal mood and affect.  Nursing note and vitals reviewed.    UC Treatments / Results  Labs (all labs ordered are listed, but only abnormal results are displayed) Labs Reviewed - No data to display  EKG None  Radiology Dg Finger Little Left  Result Date: 02/24/2018 CLINICAL DATA:  DIP dislocation. EXAM: LEFT LITTLE FINGER 2+V COMPARISON:  Fifth finger radiograph February 24, 2018 at 1904 hours FINDINGS: There is no evidence of fracture or dislocation. There is no evidence of arthropathy or other focal bone abnormality. Soft tissues are unremarkable. IMPRESSION: Successful closed reduction of fifth DIP dislocation, no fracture deformity. Electronically Signed   By: Awilda Metro M.D.   On: 02/24/2018 19:57   Dg Finger Little Left  Result Date: 02/24/2018 CLINICAL DATA:  Acute LEFT little finger pain following fall today. Initial encounter. EXAM: LEFT LITTLE FINGER 2+V COMPARISON:  None. FINDINGS: Dorsal dislocation at the DIP joint is noted. There is no definite fracture identified. No other abnormalities are identified. IMPRESSION: Dorsal dislocation at the DIP joint without fracture identified. Electronically Signed   By: Harmon Pier M.D.   On: 02/24/2018 19:31    Procedures Procedures (including critical care time) Closed reduction performed without digital block, distal phalange pulled outward and pushed towards palmar surface, initial pop felt, patient tolerated without any immediate complications. Neurovascularly intact post procedure.   Medications Ordered in UC Medications - No data to  display  Initial Impression / Assessment and Plan / UC Course  I have reviewed the triage vital signs and the nursing notes.  Pertinent labs & imaging results that were available during my care of the patient were reviewed by me and considered in my medical decision making (see chart for details).    Left index DIP dislocation, xrays obtained post reduction, static splint applied, follow up with ortho. Anti-inflammatories for pain. Tramadol, 8 tablets provided,  for severe pain/nighttime pain. Discussed drowsiness regarding tramadol. Light duty at work until cleared by ortho. Right knee likely contusion/sprain, NSAIDs. Discussed strict return precautions. Patient verbalized understanding and is agreeable with plan.  Final Clinical Impressions(s) / UC Diagnoses   Final diagnoses:  Dislocation of finger, initial encounter     Discharge  Instructions     Please wear splint for the next 2 weeks, please go ahead and set up a follow up appointment with orthopedics  Use anti-inflammatories for pain/swelling. You may take up to 800 mg Ibuprofen every 8 hours with food. You may supplement Ibuprofen with Tylenol [phone] mg every 8 hours.   Tramadol for more severe pain or nighttime pain, will cause drowsiness  Watch for healing of abrasions- return if developing pain, redness, swelling or drainage Follow up if finger pain worsening   ED Prescriptions    Medication Sig Dispense Auth. Provider   traMADol (ULTRAM) 50 MG tablet  (Status: Discontinued) Take 1 tablet (50 mg total) by mouth every 6 (six) hours as needed for up to 2 days. 8 tablet Wieters, Hallie C, PA-C   ibuprofen (ADVIL,MOTRIN) 800 MG tablet Take 1 tablet (800 mg total) by mouth 3 (three) times daily. 21 tablet Wieters, Hallie C, PA-C   traMADol (ULTRAM) 50 MG tablet Take 1 tablet (50 mg total) by mouth every 6 (six) hours as needed for up to 2 days. 8 tablet Wieters, Hallie C, PA-C   ibuprofen (ADVIL,MOTRIN) 800 MG tablet Take 1  tablet (800 mg total) by mouth 3 (three) times daily. 21 tablet Wieters, [place] C, PA-C     Controlled Substance Prescriptions [place] Controlled Substance Registry consulted? No   Lew Dawes, [place] 02/24/18 2245

## 2018-05-27 ENCOUNTER — Ambulatory Visit: Payer: 59 | Admitting: Family Medicine

## 2019-05-19 IMAGING — DX DG FINGER LITTLE 2+V*L*
3 series · 3 of 3 positions shown · non-contrast
Comparison: Fifth finger radiograph February 24, 2018 at 3516 hours

CLINICAL DATA: DIP dislocation.

EXAM:
LEFT LITTLE FINGER 2+V

[finger ap]
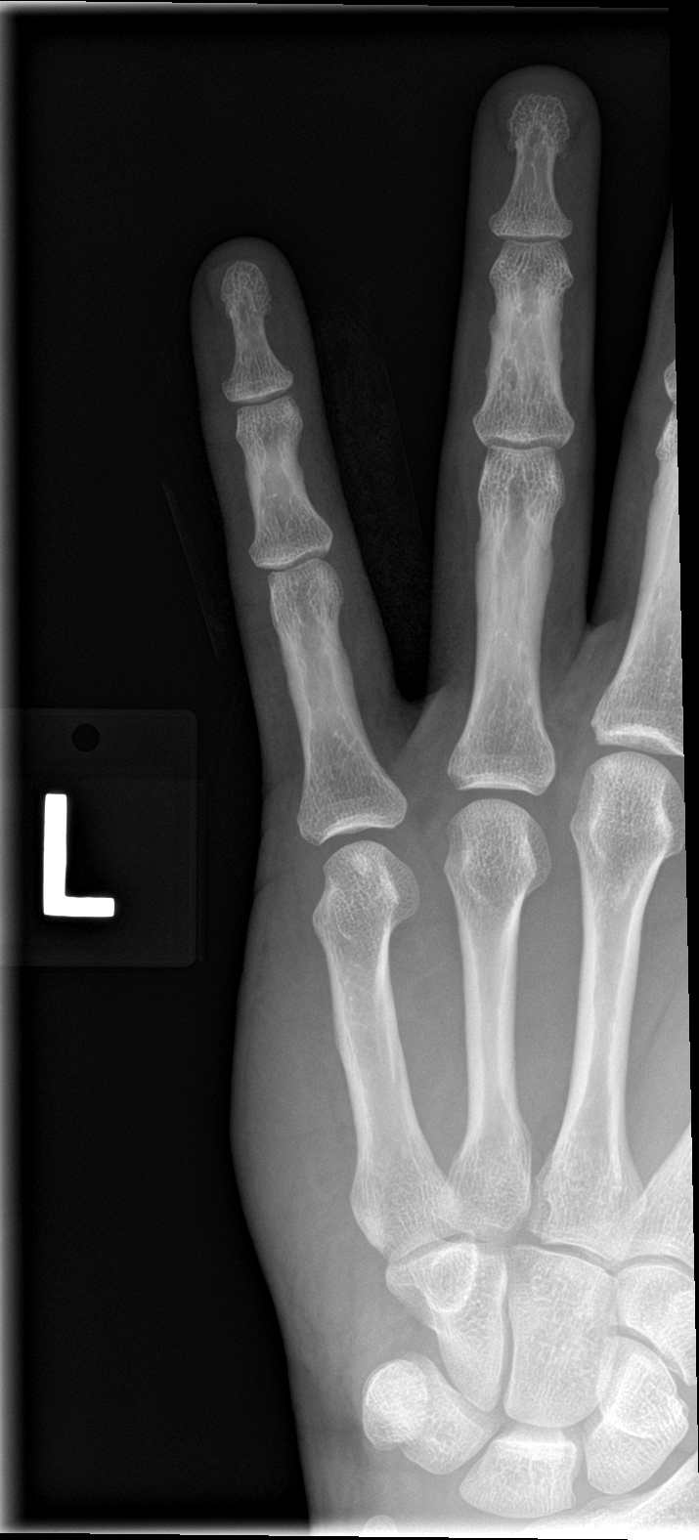

[finger obl]
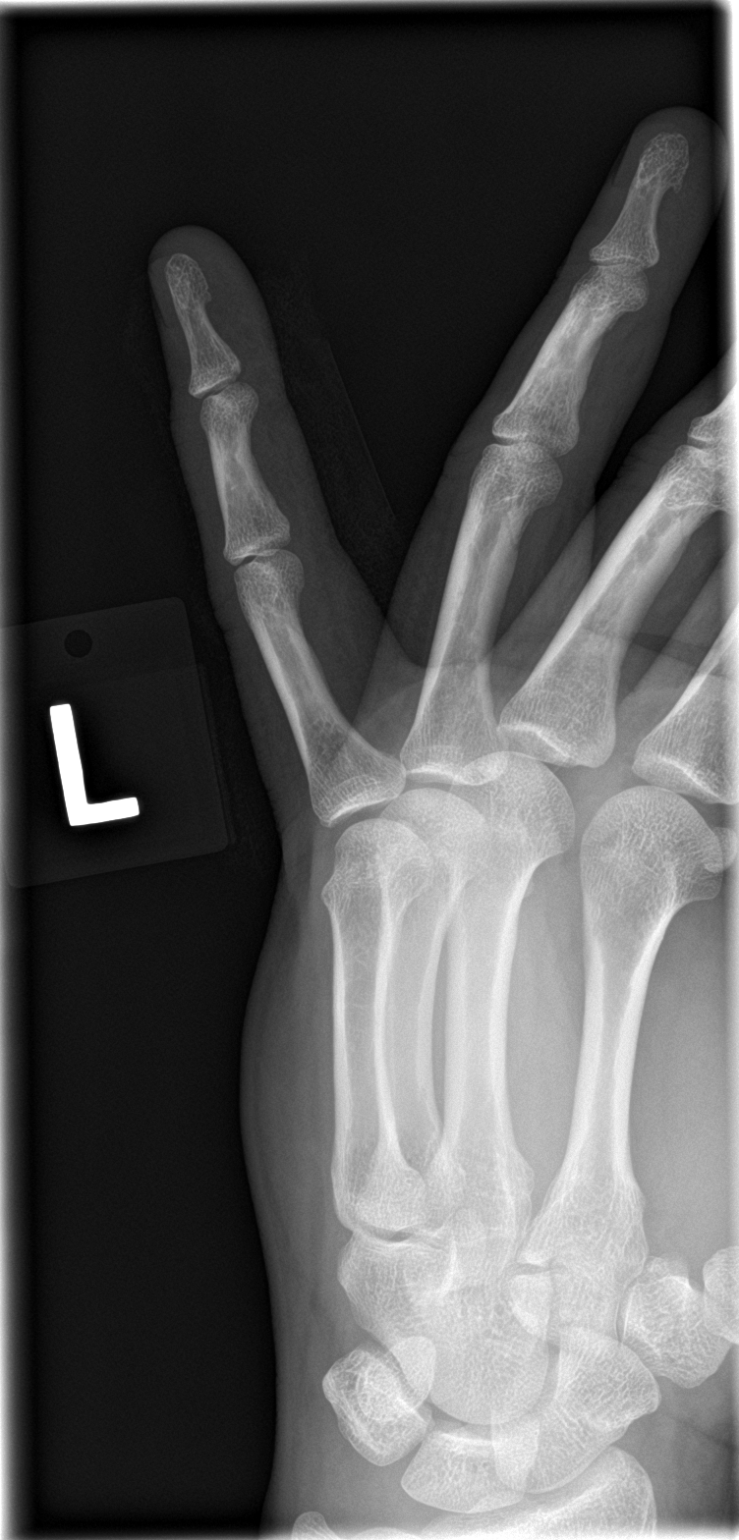

[finger lat]
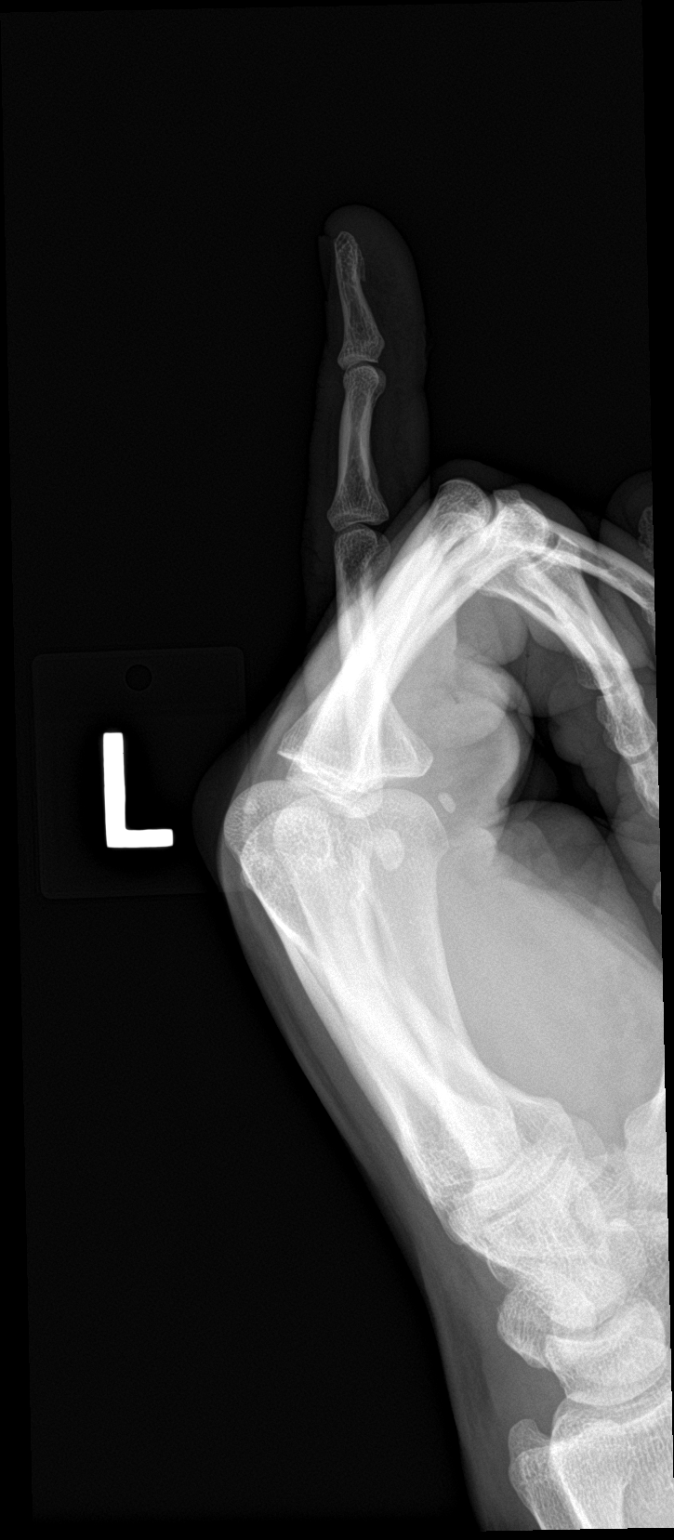

[3 of 3 positions shown; findings below may reference images not displayed]

FINDINGS: There is no evidence of fracture or dislocation. There is no
evidence of arthropathy or other focal bone abnormality. Soft
tissues are unremarkable.
IMPRESSION: Successful closed reduction of fifth DIP dislocation, no fracture
deformity.

## 2019-05-19 IMAGING — DX DG FINGER LITTLE 2+V*L*
3 series · 3 of 3 positions shown · non-contrast
Comparison: None.

CLINICAL DATA: Acute LEFT little finger pain following fall today.
Initial encounter.

EXAM:
LEFT LITTLE FINGER 2+V

[finger ap]
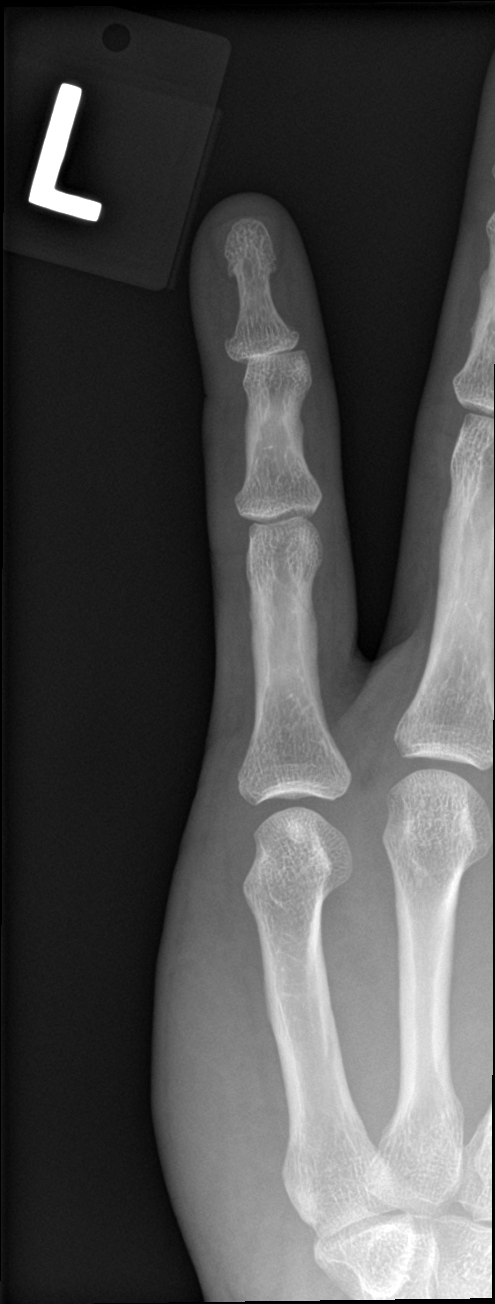

[finger obl]
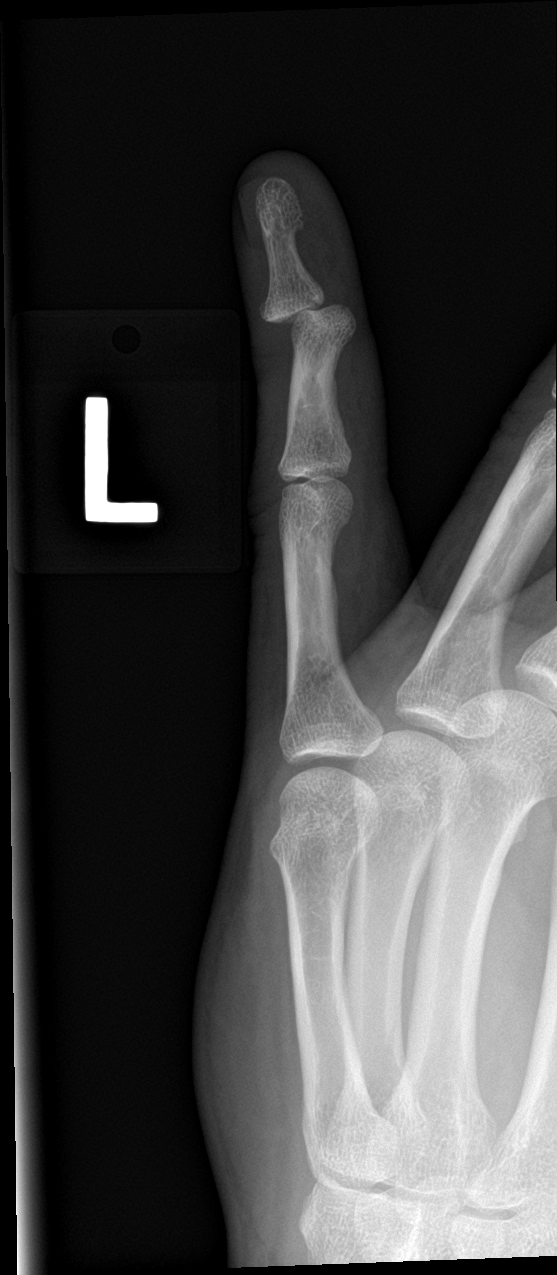

[finger lat]
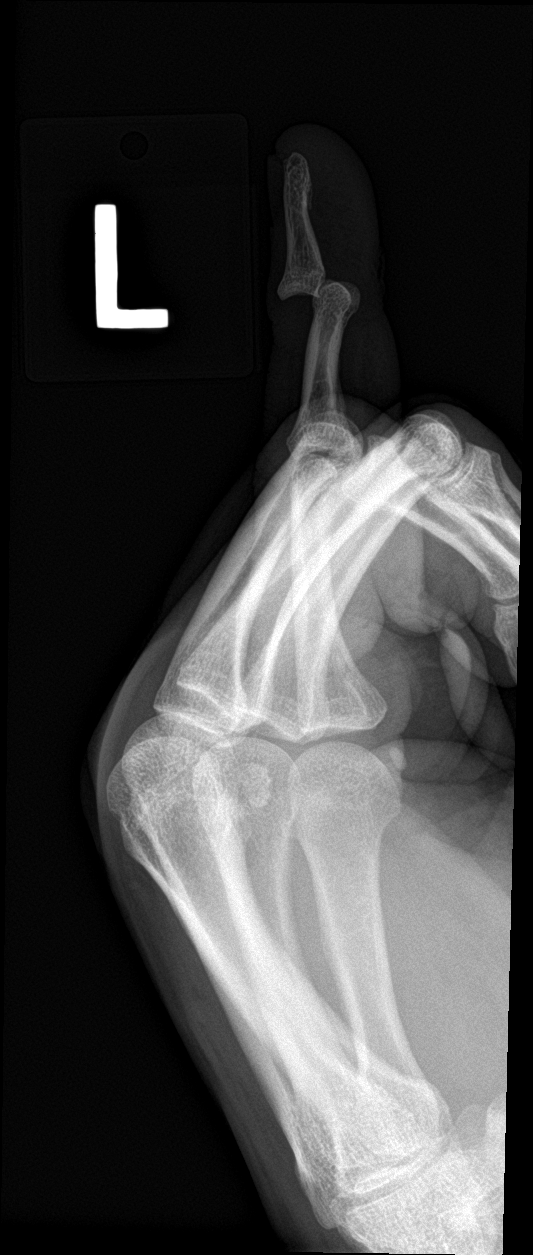

[3 of 3 positions shown; findings below may reference images not displayed]

FINDINGS: Dorsal dislocation at the DIP joint is noted.

There is no definite fracture identified.

No other abnormalities are identified.
IMPRESSION: Dorsal dislocation at the DIP joint without fracture identified.

## 2022-01-27 ENCOUNTER — Other Ambulatory Visit: Payer: Self-pay

## 2022-01-27 ENCOUNTER — Other Ambulatory Visit (HOSPITAL_BASED_OUTPATIENT_CLINIC_OR_DEPARTMENT_OTHER): Payer: Self-pay

## 2022-01-27 ENCOUNTER — Emergency Department (HOSPITAL_BASED_OUTPATIENT_CLINIC_OR_DEPARTMENT_OTHER)
Admission: EM | Admit: 2022-01-27 | Discharge: 2022-01-27 | Disposition: A | Discharge status: Home / Self Care | Payer: No Typology Code available for payment source | Attending: Emergency Medicine | Admitting: Emergency Medicine

## 2022-01-27 ENCOUNTER — Encounter (HOSPITAL_BASED_OUTPATIENT_CLINIC_OR_DEPARTMENT_OTHER): Payer: Self-pay | Admitting: Emergency Medicine

## 2022-01-27 ENCOUNTER — Emergency Department (HOSPITAL_BASED_OUTPATIENT_CLINIC_OR_DEPARTMENT_OTHER): Payer: 59

## 2022-01-27 DIAGNOSIS — S8991XA Unspecified injury of right lower leg, initial encounter: Secondary | ICD-10-CM | POA: Diagnosis present

## 2022-01-27 DIAGNOSIS — S8001XA Contusion of right knee, initial encounter: Secondary | ICD-10-CM | POA: Diagnosis not present

## 2022-01-27 DIAGNOSIS — W19XXXA Unspecified fall, initial encounter: Secondary | ICD-10-CM

## 2022-01-27 DIAGNOSIS — T07XXXA Unspecified multiple injuries, initial encounter: Secondary | ICD-10-CM

## 2022-01-27 DIAGNOSIS — S5001XA Contusion of right elbow, initial encounter: Secondary | ICD-10-CM | POA: Diagnosis not present

## 2022-01-27 DIAGNOSIS — S60042A Contusion of left ring finger without damage to nail, initial encounter: Secondary | ICD-10-CM | POA: Insufficient documentation

## 2022-01-27 DIAGNOSIS — E119 Type 2 diabetes mellitus without complications: Secondary | ICD-10-CM | POA: Insufficient documentation

## 2022-01-27 DIAGNOSIS — Y990 Civilian activity done for income or pay: Secondary | ICD-10-CM | POA: Diagnosis not present

## 2022-01-27 MED ORDER — NAPROXEN 500 MG PO TABS
500.0000 mg | ORAL_TABLET | Freq: Two times a day (BID) | ORAL | 0 refills | Status: AC
  Filled 2022-01-27: qty 30, 15d supply, fill #0

## 2022-01-27 MED ORDER — IBUPROFEN 800 MG PO TABS
800.0000 mg | ORAL_TABLET | Freq: Once | ORAL | Status: AC
  Administered 2022-01-27: 800 mg via ORAL
  Filled 2022-01-27: qty 1

## 2022-01-27 MED ORDER — HYDROCODONE-ACETAMINOPHEN 5-325 MG PO TABS
1.0000 | ORAL_TABLET | Freq: Once | ORAL | Status: AC
  Administered 2022-01-27: 1 via ORAL
  Filled 2022-01-27: qty 1

## 2022-01-27 NOTE — Discharge Instructions (Signed)
Your x-rays are negative for fracture.  Take the pain medication as prescribed, use ice and elevation.  As we discussed you may need to have an MRI of your knee for further evaluation of ligament or tendon injury.  Follow-up with your orthopedic doctor and your primary doctor.  Return to the ED with worsening pain, weakness, numbness, tingling, or other concerns

## 2022-01-27 NOTE — ED Notes (Signed)
Ice pack for knee on arrival
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
